# Patient Record
Sex: Female | Born: 1980 | Race: White | Hispanic: No | Marital: Married | Smoking: Never smoker
Health system: Southern US, Community
[De-identification: ages and names within clinical notes are randomized; demographics above are authoritative.]

## PROBLEM LIST (undated history)

## (undated) DIAGNOSIS — T7840XA Allergy, unspecified, initial encounter: Secondary | ICD-10-CM

## (undated) HISTORY — DX: Allergy, unspecified, initial encounter: T78.40XA

---

## 2002-01-08 ENCOUNTER — Other Ambulatory Visit: Admission: RE | Admit: 2002-01-08 | Discharge: 2002-01-08 | Payer: Self-pay | Admitting: Family Medicine

## 2003-01-27 ENCOUNTER — Other Ambulatory Visit: Admission: RE | Admit: 2003-01-27 | Discharge: 2003-01-27 | Payer: Self-pay | Admitting: Family Medicine

## 2004-06-21 ENCOUNTER — Other Ambulatory Visit: Admission: RE | Admit: 2004-06-21 | Discharge: 2004-06-21 | Payer: Self-pay | Admitting: Obstetrics and Gynecology

## 2005-07-11 ENCOUNTER — Other Ambulatory Visit: Admission: RE | Admit: 2005-07-11 | Discharge: 2005-07-11 | Payer: Self-pay | Admitting: Obstetrics and Gynecology

## 2006-08-05 ENCOUNTER — Other Ambulatory Visit: Admission: RE | Admit: 2006-08-05 | Discharge: 2006-08-05 | Payer: Self-pay | Admitting: Obstetrics and Gynecology

## 2008-12-21 ENCOUNTER — Observation Stay (HOSPITAL_COMMUNITY): Admission: RE | Admit: 2008-12-21 | Discharge: 2008-12-21 | Payer: Self-pay | Admitting: Obstetrics and Gynecology

## 2008-12-30 ENCOUNTER — Inpatient Hospital Stay (HOSPITAL_COMMUNITY): Admission: AD | Admit: 2008-12-30 | Discharge: 2009-01-02 | Payer: Self-pay | Admitting: Obstetrics and Gynecology

## 2010-05-22 ENCOUNTER — Inpatient Hospital Stay (HOSPITAL_COMMUNITY): Admission: AD | Admit: 2010-05-22 | Discharge: 2010-05-24 | Payer: Self-pay | Admitting: Obstetrics and Gynecology

## 2010-12-08 LAB — CBC
HCT: 35.7 % — ABNORMAL LOW (ref 36.0–46.0)
MCHC: 33.2 g/dL (ref 30.0–36.0)
Platelets: 225 10*3/uL (ref 150–400)
Platelets: 260 10*3/uL (ref 150–400)
RBC: 3.48 MIL/uL — ABNORMAL LOW (ref 3.87–5.11)
RDW: 16.4 % — ABNORMAL HIGH (ref 11.5–15.5)
RDW: 16.4 % — ABNORMAL HIGH (ref 11.5–15.5)
WBC: 11.3 10*3/uL — ABNORMAL HIGH (ref 4.0–10.5)
WBC: 16.5 10*3/uL — ABNORMAL HIGH (ref 4.0–10.5)

## 2010-12-08 LAB — RPR: RPR Ser Ql: NONREACTIVE

## 2011-01-03 LAB — CBC
HCT: 26.4 % — ABNORMAL LOW (ref 36.0–46.0)
HCT: 34.1 % — ABNORMAL LOW (ref 36.0–46.0)
Hemoglobin: 11.2 g/dL — ABNORMAL LOW (ref 12.0–15.0)
MCV: 89.4 fL (ref 78.0–100.0)
Platelets: 164 10*3/uL (ref 150–400)
RBC: 2.95 MIL/uL — ABNORMAL LOW (ref 3.87–5.11)
RBC: 3.87 MIL/uL (ref 3.87–5.11)
RDW: 15.6 % — ABNORMAL HIGH (ref 11.5–15.5)
WBC: 11.8 10*3/uL — ABNORMAL HIGH (ref 4.0–10.5)
WBC: 12.4 10*3/uL — ABNORMAL HIGH (ref 4.0–10.5)

## 2011-01-03 LAB — RPR: RPR Ser Ql: NONREACTIVE

## 2011-02-06 NOTE — H&P (Signed)
NAMEJADORE, MCGUFFIN                ACCOUNT NO.:  [id number]   MEDICAL RECORD NO.:  [id number]          PATIENT TYPE:  INP   LOCATION:  9164                          F[facility]ILITY:  [facility]   PHYSICIAN:  Hal Morales, M.D.DATE OF BIRTH:  May 06, 1981   DATE OF ADMISSION:  12/30/2008  DATE OF DISCHARGE:                              HISTORY & PHYSICAL   Marissa Copeland is a 30 year old gravida 1, para 0 at 48 weeks who presents  today in early labor.  She denies any leaking or bleeding and reports  positive fetal movement.   Pregnancy has been remarkable for:  1. Successful external version on December 21, 2008.  2. History of migraines.  3. History of depression, but no current medications.  4. Anemia.  5. Group B strep negative.   PRENATAL LABS:  Blood type is A+.  Rh antibody negative.  VDRL  nonreactive.  Rubella titer positive.  Hepatitis B surface antigen is  negative.  She declined first trimester screen.  She also declined  quadruple screen.  She had a normal Glucola.  Hemoglobin upon entering  the practice was 11.5; it was 10.8 at 29 weeks.  Pap report was not  noted on her prenatal.  GC and chlamydia cultures were declined in the  first trimester.  GC and chlamydia cultures were negative in the third  trimester.  Group B strep culture was also negative at that time.   HISTORY OF PRESENT PREGNANCY:  The patient entered care at approximately  7 weeks.  She had some nausea, for which she was placed on Phenergan and  Reglan.  She had an ultrasound at her first visit, which gave an [facility] of  January 06, 2009 -- which was approximately 8 days differently from her  LMP dating of December 29, 2008.  She was still having some vomiting at  night by 12 weeks; Zofran had been tried, but that was not helpful.  She  declined first and second trimester genetic screening.  She received  H1N1 at 16 weeks.  She had an ultrasound at 19 weeks, showing normal  growth, posterior placenta and normal cervical length.   One-hour Glucola  was normal.  Hemoglobin was 10.9 at 29 weeks.  She was recommended to  start iron-rich foods.  She had some vulvar itching at 39 weeks, but no  significant findings were noted.  Group B strep culture and other  cultures were negative at 36 weeks.  At 37 weeks she had an ultrasound  for position.  The fetus was noted to be breech.  She was offered the  option of observation, C-section or external version.  She wished to  proceed with external version.  She had normal fluid at that time.  She  had a successful external version on December 21, 2008 with Dr. Su Hilt,  and the fetus has remained in a vertex position.   OBSTETRICAL HISTORY:  The patient is a primigravida.   MEDICAL HISTORY:  She reports the usual childhood illnesses.  She is a  previous birth control [place] and [place] user.  She has had one  UTI in the  past.   SURGICAL HISTORY:  Includes wisdom teeth x2 in April 2009, and gum  surgery in 2008.  She has had some mood swings and rebound depression  when she stopped treatment for migraines prior to pregnancy.   She has an allergy to AMOXICILLIN which causes her to break out in  hives.   FAMILY HISTORY:  Her paternal grandfather had a pacemaker.  Maternal  grandfather and maternal aunt had type 2 diabetes.  Maternal grandfather  had Alzheimer's.  Maternal grandmother had breast cancer after  menopause.  Paternal uncle had depression, and had a suicide attempt.   GENETIC HISTORY:  Unremarkable.  The patient does have a history of some  migraines.   SOCIAL HISTORY:  The patient is married to the father of the baby; he is  involved and supportive, name is Zonnie Landen, Montez Hageman.  (he goes by [employer]).  The patient has a  bachelor's degree; she is a Runner, broadcasting/film/video.  Her husband has  a Manufacturing engineer; he is an Event organiser.  She has been followed by  the certified nurse midwife service at [facility].  She denies  any alcohol, drug or tobacco use during this  pregnancy.   PHYSICAL EXAMINATION:  VITAL SIGNS:  Stable.  The patient is afebrile.  HEENT:  Within normal limits.  LUNGS:  Breath sounds are clear.  HEART:  Regular rate and rhythm without murmur.  BREASTS:  Soft and nontender.  ABDOMEN:  Fundal height is approximately 38 cm; estimated fetal weight  is 6.5 to 7 pounds.  Uterine contractions are every 4-6 minutes, 80  seconds in duration and strong quality.  Cervix  exam was 4, 100% vertex  at a 0 station.  Fetal heart rate is currently reactive.  EXTREMITIES:  Deep tendon reflexes are 2+ without clonus.  There is 1+  edema noted.   IMPRESSION:  1. Intrauterine pregnancy at 39 weeks.  2. History of successful external version, with fetus still in a      vertex presentation.  3. Early active labor.  4. Negative group B strep.   PLAN:  1. Admit to birthing suite and consult with Dr. Dierdre Forth as      attending physician.  2. Routine certified nurse midwife orders.  3. Patient prefers to ambulate at present, and may consider pain      medication as labor advances.      Renaldo Reel Emilee Hero, C.N.M.      Hal Morales, M.D.  Electronically Signed    VLL/MEDQ  D:  12/30/2008  T:  12/30/2008  Job:  [id number]

## 2011-02-06 NOTE — H&P (Signed)
NAMEBRENNA, Marissa Copeland                ACCOUNT NO.:  [id number]   MEDICAL RECORD NO.:  [id number]          PATIENT TYPE:  OBV   LOCATION:  9172                          [facility]ILITY:  [facility]   PHYSICIAN:  Osborn Coho, M.D.   DATE OF BIRTH:  1981-04-06   DATE OF ADMISSION:  12/21/2008  DATE OF DISCHARGE:                              HISTORY & PHYSICAL   Ms. Marissa Copeland is a 30 year old gravida 1, para 0, at 37-5/7 weeks, who  presents today for external version.  Fetus was noted to be breech on  her ultrasound over the last week and she was consented for version.  Her pregnancy has been remarkable for:  1. Recent diagnosis of breech presentation.  2. Migraines.  3. History of mild depression.   PRENATAL LABS:  Blood type is A+, Rh antibody negative.  VDRL  nonreactive.  Rubella titer positive.  Hepatitis B surface antigen  negative.  HIV is not noted on her record.  RPR is nonreactive.  Group B  strep culture is not noted on her record.  One-hour Glucola was  negative.  Hemoglobin upon entering the practice was 11.5.  It was 10.9  at 28 weeks.  One-hour Glucola was normal.  The patient declined first  trimester screen and AFP, quadruple screen.  She had a Group B strep  culture done at 34 weeks when she had some spotting.  These were all  negative as well as GC and chlamydia cultures.   HISTORY OF PRESENT PREGNANCY:  The patient entered care at approximately  8-10 weeks.  She had had an early ultrasound at 7 weeks for dating,  which gave an [facility] of January 06, 2009.  She had some vomiting in early  pregnancy.  She declined first trimester screen and quadruple screen and  AFP.  She was using Zofran and some Phenergan.  She had a normal  Glucola.  She had an ultrasound at 19 weeks showing normal growth,  placenta posterior.  She had a normal Glucola.  Hemoglobin was 10.9 at  28 weeks.  She was placed on iron supplementation.  She had some  spotting at 34 weeks.  Cervix was slightly friable at that  point and was  closed and long.  GC, chlamydia and Group B strep culture were done and  were all negative.  She then had an ultrasound over the last week  showing a breech presentation.  She was then consented for external  version.   OBSTETRICAL HISTORY:  The patient is primigravida.   MEDICAL HISTORY:  She is a previous [place] and [place] user.  Pap was  normal just prior to the onset of her pregnancy.  She reports the usual  childhood illnesses.  She has history of 1 UTI in the past.  She had  some mood swings, rebound depression when she stopped one of her  migraine medications.  The patient also has some migraines.   SURGICAL HISTORY:  Includes wisdom teeth removed in April of 2009, gum  surgery in 2008.   She is allergic to AMOXICILLIN, which causes her to break out in  a rash.   FAMILY HISTORY:  Paternal grandfather had a pacemaker.  Maternal  grandfather and maternal aunt had type 2 diabetes.  Maternal grandfather  had Alzheimer's.  Maternal grandmother had breast cancer after  menopause.  Paternal uncle, depression, had a suicide attempt.   GENETIC HISTORY:  Unremarkable.   SOCIAL HISTORY:  The patient is married to the father of the baby.  He  is involved and supportive.  His name is Nolen Mu, Montez Hageman.  The patient  is Caucasian.  She denies a religious affiliation.  She has a bachelor's  degree.  She is a Runner, broadcasting/film/video.  Her husband has his master's degree.  He is  an Event organiser.  She has been followed by the certified nurse  midwife service [facility].  She denies any alcohol, drug or  tobacco use during this pregnancy.   PHYSICAL EXAMINATION:  VITAL SIGNS:  Stable.  HEENT:  Within normal limits.  LUNGS:  Bilateral breath sounds are clear.  HEART:  Regular rate and rhythm without murmur.  BREASTS:  Soft and nontender.  ABDOMEN:  Fundal height is approximately 37 cm.  Estimated fetal weight  7 to 7-1/2 pounds.  Uterine contractions per patient report are very   minimal.  Fetus is in the 140s by Doppler.  EXTREMITIES:  Deep tendon reflexes are 2+ without clonus.  There is a  trace edema noted.  PELVIC:  Exam is deferred.   IMPRESSION:  1. Intrauterine pregnancy at 37-5/7 weeks.  2. Breech presentation on last ultrasound.  3. Group B strep negative.   PLAN:  1. Admit to [facility] per consult Dr. Su Hilt as      attending physician for external version.  2. Risks and benefits of external version were reviewed with the      patient including fetal distress, failure of method, need for      cesarean section, rupture of membranes and other risks.  The      patient and her husband seem to understand these risks and wish to      proceed.  3. Will do bedside ultrasound to verify continuing breech      presentation.      Renaldo Reel Emilee Hero, C.N.M.      Osborn Coho, M.D.  Electronically Signed    VLL/MEDQ  D:  12/21/2008  T:  12/21/2008  Job:  [id number]

## 2011-02-06 NOTE — Discharge Summary (Signed)
NAMEARADHYA, Marissa Copeland                ACCOUNT NO.:  [id number]   MEDICAL RECORD NO.:  [id number]          PATIENT TYPE:  OBV   LOCATION:  9172                          [facility]ILITY[facility]  [facility]   PHYSICIAN:  Osborn Coho, M.D.   DATE OF BIRTH:  [birth year]   DATE OF ADMISSION:  12/21/2008  DATE OF DISCHARGE:  12/21/2008                               DISCHARGE SUMMARY   Ms. Luckman is a 30 year old gravida 1, para 0 at 37-5/7 weeks who  presented for external cephalic version today secondary to fetus in  breech presentation.  The patient's pregnancy had been remarkable for,  1. Breech presentation on last ultrasound.  2. Migraines.  3. History of mild depression.   On admission, fetal heart rate was reactive.  Fetus was still breech  with the head in the left upper quadrant.  On ultrasound, she had had a  previous ultrasound on March 26 with normal fluid.  The patient was  consented for version at that time.   She then was brought in today and was given 1 dose of terbutaline and  external cephalic version was performed with a forward roll without  difficulty.  Fetal heart tracing remained reactive.  She had an  ultrasound to evaluate for fluid and AFI was 16.43 which was at the 63rd  percentile.  Fetus remained in the vertex presentation and the patient  had minimal to no contractions.  Fetal heart rate was reactive.  She was  deemed to receive full benefit of hospital stay and was discharged home.  The speculum exam was also done showing no pooling, no ferning, and  cervix was a fingertip, 60% vertex at -2station.   DISCHARGE INSTRUCTIONS:  The patient will monitor for the onset of  labor.  She will also notify us if and when she comes in that the fetus  had been previously in a breech presentation to ensure adequate  evaluation of fetal presentation early in labor process.  Fetal kick  counts were also reviewed with the patient.   DISCHARGE MEDICATIONS:  Prenatal vitamin 1 p.o. daily.   Discharge  followup will occur on April 1 at 10:30 a.m. as previously scheduled or  p.r.n.      Renaldo Reel Emilee Hero, C.N.M.      Osborn Coho, M.D.  Electronically Signed    VLL/MEDQ  D:  12/21/2008  T:  12/22/2008  Job:  [id number]

## 2012-10-02 ENCOUNTER — Ambulatory Visit (INDEPENDENT_AMBULATORY_CARE_PROVIDER_SITE_OTHER): Payer: BC Managed Care – PPO | Admitting: Obstetrics and Gynecology

## 2012-10-02 ENCOUNTER — Encounter: Payer: Self-pay | Admitting: Obstetrics and Gynecology

## 2012-10-02 VITALS — BP 100/60 | Resp 14 | Ht 63.0 in | Wt 128.0 lb

## 2012-10-02 DIAGNOSIS — Z01419 Encounter for gynecological examination (general) (routine) without abnormal findings: Secondary | ICD-10-CM

## 2012-10-02 DIAGNOSIS — F341 Dysthymic disorder: Secondary | ICD-10-CM

## 2012-10-02 DIAGNOSIS — G43909 Migraine, unspecified, not intractable, without status migrainosus: Secondary | ICD-10-CM

## 2012-10-02 DIAGNOSIS — F419 Anxiety disorder, unspecified: Secondary | ICD-10-CM

## 2012-10-02 DIAGNOSIS — F329 Major depressive disorder, single episode, unspecified: Secondary | ICD-10-CM

## 2012-10-02 DIAGNOSIS — Z124 Encounter for screening for malignant neoplasm of cervix: Secondary | ICD-10-CM

## 2012-10-02 MED ORDER — LEVONORG-ETH ESTRAD TRIPHASIC PO TABS: 1.0000 | ORAL_TABLET | Freq: Every day | ORAL | Status: AC

## 2012-10-02 NOTE — Progress Notes (Signed)
The patient reports:no complaints  Contraception:oral contraceptives (estrogen/progesterone)  Last mammogram: never Last pap: 08/27/11 WNL  GC/Chlamydia cultures offered: declined HIV/RPR/HbsAg offered:  declined HSV 1 and 2 glycoprotein offered: declined  Menstrual cycle regular and monthly: Yes Menstrual flow normal: Yes  Urinary symptoms: none Normal bowel movements: Yes Reports abuse at home: No

## 2012-10-02 NOTE — Progress Notes (Signed)
Subjective:    Marissa Copeland is a 32 y.o. female, G2P2002, who presents for an annual exam.   Patient reports:  Dojng well--no issues.  Wants refill on OCPs.  Children doing well.  May decide to home school.  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    History   Social History  . Marital Status: Married    Spouse Name: N/A    Number of Children: N/A  . Years of Education: N/A   Social History Main Topics  . Smoking status: Never Smoker   . Smokeless tobacco: Never Used  . Alcohol Use: No  . Drug Use: No  . Sexually Active: Yes -- Female partner(s)    Birth Control/ Protection: Pill     Comment: married    Other Topics Concern  . None   Social History Narrative  . None    Menstrual cycle:   LMP: Patient's last menstrual period was 09/24/2012.           Cycle: Normal on OCPs  The following portions of the patient's history were reviewed and updated as appropriate: allergies, current medications, past family history, past medical history, past social history, past surgical history and problem list.  Review of Systems Pertinent items are noted in HPI. Breast:Negative for breast lump,nipple discharge or nipple retraction Gastrointestinal: Negative for abdominal pain, change in bowel habits or rectal bleeding Urinary:negative   Objective:    BP 100/60  Resp 14  Ht 5\' 3"  (1.6 m)  Wt 128 lb (58.06 kg)  BMI 22.67 kg/m2  LMP 09/24/2012    Weight:  Wt Readings from Last 1 Encounters:  10/02/12 128 lb (58.06 kg)          BMI: Body mass index is 22.67 kg/(m^2).  General Appearance: Alert, appropriate appearance for age. No acute distress HEENT: Grossly normal Neck / Thyroid: Supple, no masses, nodes or enlargement Lungs: clear to auscultation bilaterally Back: No CVA tenderness Breast Exam: No masses or nodes.No dimpling, nipple retraction or discharge. Cardiovascular: Regular rate and rhythm. S1, S2, no murmur Gastrointestinal: Soft, non-tender, no  masses or organomegaly Pelvic Exam: Vulva and vagina appear normal. Bimanual exam reveals normal uterus and adnexa. Rectovaginal: normal rectal, no masses Lymphatic Exam: Non-palpable nodes in neck, clavicular, axillary, or inguinal regions Skin: no rash or abnormalities Neurologic: Normal gait and speech, no tremor  Psychiatric: Alert and oriented, appropriate affect.   Wet Prep:not applicable Urinalysis:not applicable UPT: Not done   Assessment:    Normal gyn exam Contraceptive management    Plan:    Mammogram: Age 47 Pap:  Done today STD screening: declined Contraception:oral contraceptives (estrogen/progesterone) --Rx OCP x 1 year ([place] generic)      Shermika Balthaser, [place], [place]

## 2012-10-03 LAB — PAP IG W/ RFLX HPV ASCU

## 2013-01-29 ENCOUNTER — Ambulatory Visit (INDEPENDENT_AMBULATORY_CARE_PROVIDER_SITE_OTHER): Payer: BC Managed Care – PPO | Admitting: Family Medicine

## 2013-01-29 VITALS — BP 104/70 | HR 105 | Temp 99.0°F | Resp 16 | Ht 63.5 in | Wt 124.0 lb

## 2013-01-29 DIAGNOSIS — R112 Nausea with vomiting, unspecified: Secondary | ICD-10-CM

## 2013-01-29 DIAGNOSIS — B349 Viral infection, unspecified: Secondary | ICD-10-CM

## 2013-01-29 DIAGNOSIS — B9789 Other viral agents as the cause of diseases classified elsewhere: Secondary | ICD-10-CM

## 2013-01-29 DIAGNOSIS — J029 Acute pharyngitis, unspecified: Secondary | ICD-10-CM

## 2013-01-29 LAB — POCT RAPID STREP A (OFFICE): Rapid Strep A Screen: NEGATIVE

## 2013-01-29 MED ORDER — ONDANSETRON 4 MG PO TBDP: ORAL_TABLET | ORAL | Status: DC

## 2013-01-29 NOTE — Progress Notes (Signed)
Subjective: 32 year old lady who has had a sore throat for couple of days. She had some ear pain. She has not documented a fever at home. She has has nausea and vomiting a couple times. She has not been feeling well. 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 She has a history of a lot of strep infections when she was little younger out of college. She is allergic to amoxicillin which caused her a rash.  Objective: Healthy-appearing and lady in no major distress. TMs normal. Throat erythematous without exudate. She has a couple of small nodes which are little tender. No posterior nodes. Chest is clear to auscultation. Heart regular without murmurs.  Assessment: Pharyngitis  Plan: Strep screen  Results for orders placed in visit on 01/29/13  POCT RAPID STREP A (OFFICE)      Result Value Range   Rapid Strep A Screen Negative  Negative   Assessment: Viral syndrome  Plan: Zofran for nausea. Fluids. Rest.

## 2013-01-29 NOTE — Patient Instructions (Addendum)
Viral Pharyngitis  Viral pharyngitis is a viral infection that produces redness, pain, and swelling (inflammation) of the throat. It can spread from person to person (contagious).  CAUSES  Viral pharyngitis is caused by inhaling a large amount of certain germs called viruses. Many different viruses cause viral pharyngitis.  SYMPTOMS  Symptoms of viral pharyngitis include:   Sore throat.   Tiredness.   Stuffy nose.   Low-grade fever.   Congestion.   Cough.  TREATMENT  Treatment includes rest, drinking plenty of fluids, and the use of over-the-counter medication (approved by your caregiver).  HOME CARE INSTRUCTIONS    Drink enough fluids to keep your urine clear or pale yellow.   Eat soft, cold foods such as ice cream, frozen ice pops, or gelatin dessert.   Gargle with warm salt water (1 tsp salt per 1 qt of water).   If over age 7, throat lozenges may be used safely.   Only take over-the-counter or prescription medicines for pain, discomfort, or fever as directed by your caregiver. Do not take aspirin.  To help prevent spreading viral pharyngitis to others, avoid:   Mouth-to-mouth contact with others.   Sharing utensils for eating and drinking.   Coughing around others.  SEEK MEDICAL CARE IF:    You are better in a few days, then become worse.   You have a fever or pain not helped by pain medicines.   There are any other changes that concern you.  Document Released: 06/20/2005 Document Revised: 12/03/2011 Document Reviewed: 11/16/2010  ExitCare Patient Information 2013 [employer].

## 2013-02-09 ENCOUNTER — Telehealth: Payer: Self-pay

## 2013-02-09 NOTE — Telephone Encounter (Signed)
zpack treats strep.  If she is having fevers, white exudate on tonsils, no cough still, ok to call in penicillin 500mg  1 tab po bid disp 20 no ref.  If she is getting worse, needs to be seen.

## 2013-02-09 NOTE — Telephone Encounter (Signed)
Actually - looks like pt was seen by dr. Alwyn Ren - not by me.  And she was advised symptomatic care, not given a zpack.  And she is allergic to amoxicillin so do NOT call in penicillin

## 2013-02-09 NOTE — Telephone Encounter (Signed)
PT STATES SHE DIDN'T HAVE STREP BUT BOTH HER PARENTS CAME OVER TO WATCH HER CHILDREN WHILE SHE WAS AT THE DR AND THEY BOTH TESTED POSITIVE FOR STREP AND HER THROAT IS STILL SORE, NEED TO HAVE SOMETHING CALLED IN AND THE Z-PAK DOESN'T HELP. PLEASE CALL PT AT [phone]   [facility] [address]

## 2013-02-09 NOTE — Telephone Encounter (Signed)
Spoke to her and she does still have sore throat. She does not have any fever, she is asking for treatment for strep, since both of her parents have had strep recently and her sore throat continues. Please advise. ( she has pcn/amox. Allergy)

## 2013-02-10 ENCOUNTER — Other Ambulatory Visit: Payer: Self-pay | Admitting: Family Medicine

## 2013-02-10 MED ORDER — AZITHROMYCIN 250 MG PO TABS: ORAL_TABLET | ORAL | Status: DC

## 2013-02-10 NOTE — Telephone Encounter (Signed)
Please call in a Zithromax prescription for her to her 250 mg #6 take 2 initially then one daily for 4 days

## 2013-02-11 NOTE — Telephone Encounter (Signed)
Thanks . I did advise her. Asked her to follow up if not better soon.

## 2013-10-12 ENCOUNTER — Other Ambulatory Visit: Payer: Self-pay | Admitting: Family Medicine

## 2013-10-12 DIAGNOSIS — Z20828 Contact with and (suspected) exposure to other viral communicable diseases: Secondary | ICD-10-CM

## 2013-10-12 MED ORDER — OSELTAMIVIR PHOSPHATE 75 MG PO CAPS: 75.0000 mg | ORAL_CAPSULE | Freq: Every day | ORAL | Status: DC

## 2013-10-12 NOTE — Progress Notes (Signed)
Husband diagnosed with influenza A last night.  Preventative.

## 2014-03-18 ENCOUNTER — Ambulatory Visit (INDEPENDENT_AMBULATORY_CARE_PROVIDER_SITE_OTHER): Payer: BC Managed Care – PPO | Admitting: Family Medicine

## 2014-03-18 ENCOUNTER — Ambulatory Visit (INDEPENDENT_AMBULATORY_CARE_PROVIDER_SITE_OTHER): Payer: BC Managed Care – PPO

## 2014-03-18 VITALS — BP 114/60 | HR 112 | Temp 99.1°F | Resp 18 | Ht 63.5 in | Wt 126.2 lb

## 2014-03-18 DIAGNOSIS — R509 Fever, unspecified: Secondary | ICD-10-CM

## 2014-03-18 DIAGNOSIS — R1012 Left upper quadrant pain: Secondary | ICD-10-CM

## 2014-03-18 DIAGNOSIS — J029 Acute pharyngitis, unspecified: Secondary | ICD-10-CM | POA: Diagnosis not present

## 2014-03-18 DIAGNOSIS — J020 Streptococcal pharyngitis: Secondary | ICD-10-CM

## 2014-03-18 LAB — POCT RAPID STREP A (OFFICE): Rapid Strep A Screen: POSITIVE — AB

## 2014-03-18 LAB — COMPREHENSIVE METABOLIC PANEL
ALT: 9 U/L (ref 0–35)
Albumin: 4.1 g/dL (ref 3.5–5.2)
Alkaline Phosphatase: 50 U/L (ref 39–117)
CO2: 23 mEq/L (ref 19–32)
Potassium: 3.6 mEq/L (ref 3.5–5.3)
Sodium: 136 mEq/L (ref 135–145)
Total Bilirubin: 0.9 mg/dL (ref 0.2–1.2)
Total Protein: 6.7 g/dL (ref 6.0–8.3)

## 2014-03-18 LAB — COMPREHENSIVE METABOLIC PANEL WITH GFR
AST: 15 U/L (ref 0–37)
BUN: 11 mg/dL (ref 6–23)
Calcium: 8.7 mg/dL (ref 8.4–10.5)
Chloride: 102 meq/L (ref 96–112)
Creat: 0.68 mg/dL (ref 0.50–1.10)
Glucose, Bld: 71 mg/dL (ref 70–99)

## 2014-03-18 LAB — CBC
HCT: 38.8 % (ref 36.0–46.0)
Hemoglobin: 12.8 g/dL (ref 12.0–15.0)
MCH: 29.9 pg (ref 26.0–34.0)
MCHC: 33 g/dL (ref 30.0–36.0)
MCV: 90.8 fL (ref 78.0–100.0)
Platelets: 163 10*3/uL (ref 150–400)
RBC: 4.27 MIL/uL (ref 3.87–5.11)
RDW: 13.5 % (ref 11.5–15.5)
WBC: 10.2 10*3/uL (ref 4.0–10.5)

## 2014-03-18 MED ORDER — CLINDAMYCIN HCL 300 MG PO CAPS: 300.0000 mg | ORAL_CAPSULE | Freq: Three times a day (TID) | ORAL | Status: DC

## 2014-03-18 NOTE — Progress Notes (Signed)
Chief Complaint:  Chief Complaint  Patient presents with  . Pain underneath rib    has had pain right below her rib since friday, sensitive to the touch  . Fever  . Generalized Body Aches  . Sore Throat    HPI: Lucianne MussKristi J Keng is a 33 y.o. female who is here for  Sore throat, left lower rib pain, left upper abd pain, fever and sore throat x 1-2 days, generalized fatigue since Friday, she has had strep throat in the past, she has taken tylenol last night fevers have been 100.8-101. Works with children. She has had no rashes, no joint pain, + nausea, no vomiting, no diarrhea. No sick contacts. Poor po.   Past Medical History  Diagnosis Date  . Allergy    History reviewed. No pertinent past surgical history. History   Social History  . Marital Status: Married    Spouse Name: N/A    Number of Children: N/A  . Years of Education: N/A   Social History Main Topics  . Smoking status: Never Smoker   . Smokeless tobacco: Never Used  . Alcohol Use: No  . Drug Use: No  . Sexual Activity: Yes    Partners: Male    Birth Control/ Protection: Pill, Condom     Comment: married    Other Topics Concern  . None   Social History Narrative  . None   Family History  Problem Relation Age of Onset  . Aneurysm Father   . Diabetes Maternal Aunt   . Diabetes Maternal Grandfather   . Fibromyalgia Paternal Grandmother   . Heart disease Paternal Grandfather    Allergies  Allergen Reactions  . Amoxicillin Rash   Prior to Admission medications   Medication Sig Start Date End Date Taking? Authorizing Lexys Milliner  levonorgestrel-ethinyl estradiol (ENPRESSE,TRIVORA) tablet Take 1 tablet by mouth daily. 10/02/12  Yes Nigel BridgemanVicki Latham, CNM     ROS: The patient denies  night sweats, unintentional weight loss, chest pain, palpitations, wheezing, dyspnea on exertion,  vomiting, dysuria, hematuria, melena, numbness,  or tingling.   All other systems have been reviewed and were otherwise negative  with the exception of those mentioned in the HPI and as above.    PHYSICAL EXAM: Filed Vitals:   03/18/14 1022  BP:   Pulse: 112  Temp: 99.1 F (37.3 C)  Resp:    Filed Vitals:   03/18/14 0854  Height: 5' 3.5" (1.613 m)  Weight: 126 lb 3.2 oz (57.244 kg)   Body mass index is 22 kg/(m^2).  General: Alert, no acute distress HEENT:  Normocephalic, atraumatic, oropharynx patent. EOMI, , minimally erythematous, no exudates. TM nl, sinus nontender Cardiovascular:  Regular rate and rhythm, no rubs murmurs or gallops.  No Carotid bruits, radial pulse intact. No pedal edema.  Respiratory: Clear to auscultation bilaterally.  No wheezes, rales, or rhonchi.  No cyanosis, no use of accessory musculature GI: No organomegaly, abdomen is soft and + LUQ abd tenderness, positive bowel sounds.  No masses. Skin: No rashes. Neurologic: Facial musculature symmetric. Psychiatric: Patient is appropriate throughout our interaction. Lymphatic: No cervical lymphadenopathy Musculoskeletal: Gait intact.   LABS: Results for orders placed in visit on 03/18/14  POCT RAPID STREP A (OFFICE)      Result Value Ref Range   Rapid Strep A Screen Positive (*) Negative     EKG/XRAY:   [facility] No cariopulm process No free air, no obstruction  ASSESSMENT/PLAN: Encounter Diagnoses  Name Primary?  . Acute pharyngitis, unspecified pharyngitis type Yes  . Fever, unspecified fever cause   . LUQ abdominal pain   . Streptococcal sore throat    She has had levaquin for strep in the past, I am not going to give her this. Will treat with Clindamycin since she ahs a PCN allergy and also she state z packs do not work for her.  Rx clindamycin 300 mg TID x 10 days Salt water gargles Tylenol/motrin prn pain and fever Labs pending F/u prn  Gross sideeffects, risk and benefits, and alternatives of medications d/w patient. Patient is aware that all medications have potential  sideeffects and we are unable to predict every sideeffect or drug-drug interaction that may occur.  LE, THAO PHUONG, DO 03/18/2014 10:30 AM

## 2014-03-18 NOTE — Patient Instructions (Signed)
Strep Throat Strep throat is an infection of the throat caused by a bacteria named Streptococcus pyogenes. Your caregiver may call the infection streptococcal "tonsillitis" or "pharyngitis" depending on whether there are signs of inflammation in the tonsils or back of the throat. Strep throat is most common in children aged 34-15 years during the cold months of the year, but it can occur in people of any age during any season. This infection is spread from person to person (contagious) through coughing, sneezing, or other close contact. SYMPTOMS   Fever or chills.  Painful, swollen, red tonsils or throat.  Pain or difficulty when swallowing.  White or yellow spots on the tonsils or throat.  Swollen, tender lymph nodes or "glands" of the neck or under the jaw.  Red rash all over the body (rare). DIAGNOSIS  Many different infections can cause the same symptoms. A test must be done to confirm the diagnosis so the right treatment can be given. A "rapid strep test" can help your caregiver make the diagnosis in a few minutes. If this test is not available, a light swab of the infected area can be used for a throat culture test. If a throat culture test is done, results are usually available in a day or two. TREATMENT  Strep throat is treated with antibiotic medicine. HOME CARE INSTRUCTIONS   Gargle with 1 tsp of salt in 1 cup of warm water, 3-4 times per day or as needed for comfort.  Family members who also have a sore throat or fever should be tested for strep throat and treated with antibiotics if they have the strep infection.  Make sure everyone in your household washes their hands well.  Do not share food, drinking cups, or personal items that could cause the infection to spread to others.  You may need to eat a soft food diet until your sore throat gets better.  Drink enough water and fluids to keep your urine clear or pale yellow. This will help prevent dehydration.  Get plenty of  rest.  Stay home from school, daycare, or work until you have been on antibiotics for 24 hours.  Only take over-the-counter or prescription medicines for pain, discomfort, or fever as directed by your caregiver.  If antibiotics are prescribed, take them as directed. Finish them even if you start to feel better. SEEK MEDICAL CARE IF:   The glands in your neck continue to enlarge.  You develop a rash, cough, or earache.  You cough up green, yellow-brown, or bloody sputum.  You have pain or discomfort not controlled by medicines.  Your problems seem to be getting worse rather than better. SEEK IMMEDIATE MEDICAL CARE IF:   You develop any new symptoms such as vomiting, severe headache, stiff or painful neck, chest pain, shortness of breath, or trouble swallowing.  You develop severe throat pain, drooling, or changes in your voice.  You develop swelling of the neck, or the skin on the neck becomes red and tender.  You have a fever.  You develop signs of dehydration, such as fatigue, dry mouth, and decreased urination.  You become increasingly sleepy, or you cannot wake up completely. Document Released: 09/07/2000 Document Revised: 08/27/2012 Document Reviewed: 11/09/2010 Exit[employer] Patient Information 2015 [employer]. This information is not intended to replace advice given to you by your health care Dossie Ocanas. Make sure you discuss any questions you have with your health care Minnie Shi.  

## 2014-03-19 ENCOUNTER — Telehealth: Payer: Self-pay | Admitting: Family Medicine

## 2014-03-19 LAB — EPSTEIN-BARR VIRUS VCA ANTIBODY PANEL
EBV EA IgG: 13.2 U/mL — ABNORMAL HIGH
EBV NA IgG: 61.1 U/mL — ABNORMAL HIGH
EBV VCA IgG: 193 U/mL — ABNORMAL HIGH
EBV VCA IgM: <10 U/mL

## 2014-03-19 NOTE — Telephone Encounter (Signed)
Spoke with pt about labs, she is doing ok, has some nausea withmeds, but will see how she does, she doe snto want any nausea meds at thsi time since had constipation issues. Will let m eknow if she needs a diffreent abx, she has allergies to PCN. Z pack does not work for her

## 2014-04-30 ENCOUNTER — Emergency Department (HOSPITAL_COMMUNITY): Payer: BC Managed Care – PPO

## 2014-04-30 ENCOUNTER — Encounter (HOSPITAL_COMMUNITY): Payer: Self-pay | Admitting: Emergency Medicine

## 2014-04-30 ENCOUNTER — Emergency Department (HOSPITAL_COMMUNITY)
Admission: EM | Admit: 2014-04-30 | Discharge: 2014-04-30 | Disposition: A | Discharge status: Home / Self Care | Payer: BC Managed Care – PPO | Attending: Emergency Medicine | Admitting: Emergency Medicine

## 2014-04-30 DIAGNOSIS — Z79899 Other long term (current) drug therapy: Secondary | ICD-10-CM | POA: Insufficient documentation

## 2014-04-30 DIAGNOSIS — Z880 Allergy status to penicillin: Secondary | ICD-10-CM | POA: Insufficient documentation

## 2014-04-30 DIAGNOSIS — K8020 Calculus of gallbladder without cholecystitis without obstruction: Secondary | ICD-10-CM

## 2014-04-30 DIAGNOSIS — Z792 Long term (current) use of antibiotics: Secondary | ICD-10-CM | POA: Insufficient documentation

## 2014-04-30 DIAGNOSIS — Z3202 Encounter for pregnancy test, result negative: Secondary | ICD-10-CM | POA: Insufficient documentation

## 2014-04-30 DIAGNOSIS — N200 Calculus of kidney: Secondary | ICD-10-CM | POA: Insufficient documentation

## 2014-04-30 DIAGNOSIS — R1011 Right upper quadrant pain: Secondary | ICD-10-CM | POA: Insufficient documentation

## 2014-04-30 LAB — LIPASE, BLOOD: Lipase: 47 U/L (ref 11–59)

## 2014-04-30 LAB — COMPREHENSIVE METABOLIC PANEL
ALBUMIN: 3.9 g/dL (ref 3.5–5.2)
ALT: 24 U/L (ref 0–35)
ANION GAP: 14 (ref 5–15)
AST: 34 U/L (ref 0–37)
Alkaline Phosphatase: 60 U/L (ref 39–117)
BUN: 14 mg/dL (ref 6–23)
CALCIUM: 9.5 mg/dL (ref 8.4–10.5)
CO2: 23 mEq/L (ref 19–32)
CREATININE: 0.7 mg/dL (ref 0.50–1.10)
Chloride: 102 mEq/L (ref 96–112)
GFR calc non Af Amer: >90 mL/min (ref >90)
Glucose, Bld: 118 mg/dL — ABNORMAL HIGH (ref 70–99)
POTASSIUM: 3.9 meq/L (ref 3.7–5.3)
Sodium: 139 mEq/L (ref 137–147)
TOTAL PROTEIN: 7.5 g/dL (ref 6.0–8.3)
Total Bilirubin: 0.9 mg/dL (ref 0.3–1.2)

## 2014-04-30 LAB — CBC WITH DIFFERENTIAL/PLATELET
BASOS ABS: 0 10*3/uL (ref 0.0–0.1)
Basophils Relative: 0 % (ref 0–1)
Eosinophils Absolute: 0 10*3/uL (ref 0.0–0.7)
Eosinophils Relative: 0 % (ref 0–5)
HEMATOCRIT: 38.3 % (ref 36.0–46.0)
HEMOGLOBIN: 12.9 g/dL (ref 12.0–15.0)
LYMPHS PCT: 7 % — AB (ref 12–46)
Lymphs Abs: 1 10*3/uL (ref 0.7–4.0)
MCH: 29.4 pg (ref 26.0–34.0)
MCHC: 33.7 g/dL (ref 30.0–36.0)
MCV: 87.2 fL (ref 78.0–100.0)
MONO ABS: 0.8 10*3/uL (ref 0.1–1.0)
MONOS PCT: 6 % (ref 3–12)
NEUTROS ABS: 12.5 10*3/uL — AB (ref 1.7–7.7)
NEUTROS PCT: 87 % — AB (ref 43–77)
Platelets: 258 10*3/uL (ref 150–400)
RBC: 4.39 MIL/uL (ref 3.87–5.11)
RDW: 13.2 % (ref 11.5–15.5)
WBC: 14.3 10*3/uL — AB (ref 4.0–10.5)

## 2014-04-30 LAB — URINALYSIS, ROUTINE W REFLEX MICROSCOPIC
GLUCOSE, UA: NEGATIVE mg/dL
Ketones, ur: 15 mg/dL — AB
Nitrite: NEGATIVE
PROTEIN: 100 mg/dL — AB
SPECIFIC GRAVITY, URINE: 1.031 — AB (ref 1.005–1.030)
UROBILINOGEN UA: 1 mg/dL (ref 0.0–1.0)
pH: 7 (ref 5.0–8.0)

## 2014-04-30 LAB — URINE MICROSCOPIC-ADD ON

## 2014-04-30 LAB — POC URINE PREG, ED: PREG TEST UR: NEGATIVE

## 2014-04-30 MED ORDER — SODIUM CHLORIDE 0.9 % IV BOLUS (SEPSIS)
1000.0000 mL | Freq: Once | INTRAVENOUS | Status: AC
  Administered 2014-04-30: 1000 mL via INTRAVENOUS

## 2014-04-30 MED ORDER — MORPHINE SULFATE 4 MG/ML IJ SOLN
4.0000 mg | Freq: Once | INTRAMUSCULAR | Status: AC
  Administered 2014-04-30: 4 mg via INTRAVENOUS
  Filled 2014-04-30: qty 1

## 2014-04-30 MED ORDER — HYDROCODONE-ACETAMINOPHEN 5-325 MG PO TABS: 1.0000 | ORAL_TABLET | Freq: Four times a day (QID) | ORAL | Status: AC | PRN

## 2014-04-30 MED ORDER — ONDANSETRON HCL 4 MG PO TABS: 4.0000 mg | ORAL_TABLET | Freq: Four times a day (QID) | ORAL | Status: DC

## 2014-04-30 MED ORDER — ONDANSETRON HCL 4 MG/2ML IJ SOLN
4.0000 mg | Freq: Once | INTRAMUSCULAR | Status: AC
  Administered 2014-04-30: 4 mg via INTRAVENOUS
  Filled 2014-04-30: qty 2

## 2014-04-30 NOTE — ED Provider Notes (Signed)
Medical screening examination/treatment/procedure(s) were conducted as a shared visit with non-physician practitioner(s) and myself.  I personally evaluated the patient during the encounter.   EKG Interpretation None      I interviewed and examined the patient. Lungs are CTAB. Cardiac exam wnl. Abdomen soft w/ mild ttp of epig/ruq area. Cholelithiasis noted on US. Possibly biliary colic. Mild elev of wbc but LFT's wnl. Pain controlled. Will d/c home.   Purvis SheffieldForrest Jeoffrey Eleazer, MD 04/30/14 [id number]

## 2014-04-30 NOTE — Discharge Instructions (Signed)
Biliary Colic  °Biliary colic is a steady or irregular pain in the upper abdomen. It is usually under the right side of the rib cage. It happens when gallstones interfere with the normal flow of bile from the gallbladder. Bile is a liquid that helps to digest fats. Bile is made in the liver and stored in the gallbladder. When you eat a meal, bile passes from the gallbladder through the cystic duct and the common bile duct into the small intestine. There, it mixes with partially digested food. If a gallstone blocks either of these ducts, the normal flow of bile is blocked. The muscle cells in the bile duct contract forcefully to try to move the stone. This causes the pain of biliary colic.  °SYMPTOMS  °· A person with biliary colic usually complains of pain in the upper abdomen. This pain can be: °· In the center of the upper abdomen just below the breastbone. °· In the upper-right part of the abdomen, near the gallbladder and liver. °· Spread back toward the right shoulder blade. °· Nausea and vomiting. °· The pain usually occurs after eating. °· Biliary colic is usually triggered by the digestive system's demand for bile. The demand for bile is high after fatty meals. Symptoms can also occur when a person who has been fasting suddenly eats a very large meal. Most episodes of biliary colic pass after 1 to 5 hours. After the most intense pain passes, your abdomen may continue to ache mildly for about 24 hours. °DIAGNOSIS  °After you describe your symptoms, your caregiver will perform a physical exam. He or she will pay attention to the upper right portion of your belly (abdomen). This is the area of your liver and gallbladder. An ultrasound will help your caregiver look for gallstones. Specialized scans of the gallbladder may also be done. Blood tests may be done, especially if you have fever or if your pain persists. °PREVENTION  °Biliary colic can be prevented by controlling the risk factors for gallstones. Some of  these risk factors, such as heredity, increasing age, and pregnancy are a normal part of life. Obesity and a high-fat diet are risk factors you can change through a healthy lifestyle. Women going through menopause who take hormone replacement therapy (estrogen) are also more likely to develop biliary colic. °TREATMENT  °· Pain medication may be prescribed. °· You may be encouraged to eat a fat-free diet. °· If the first episode of biliary colic is severe, or episodes of colic keep retuning, surgery to remove the gallbladder (cholecystectomy) is usually recommended. This procedure can be done through small incisions using an instrument called a laparoscope. The procedure often requires a brief stay in the hospital. Some people can leave the hospital the same day. It is the most widely used treatment in people troubled by painful gallstones. It is effective and safe, with no complications in more than 90% of cases. °· If surgery cannot be done, medication that dissolves gallstones may be used. This medication is expensive and can take months or years to work. Only small stones will dissolve. °· Rarely, medication to dissolve gallstones is combined with a procedure called shock-wave lithotripsy. This procedure uses carefully aimed shock waves to break up gallstones. In many people treated with this procedure, gallstones form again within a few years. °PROGNOSIS  °If gallstones block your cystic duct or common bile duct, you are at risk for repeated episodes of biliary colic. There is also a 25% chance that you will develop   a gallbladder infection(acute cholecystitis), or some other complication of gallstones within 10 to 20 years. If you have surgery, schedule it at a time that is convenient for you and at a time when you are not sick. °HOME CARE INSTRUCTIONS  °· Drink plenty of clear fluids. °· Avoid fatty, greasy or fried foods, or any foods that make your pain worse. °· Take medications as directed. °SEEK MEDICAL  CARE IF:  °· You develop a fever over 100.5° F (38.1° C). °· Your pain gets worse over time. °· You develop nausea that prevents you from eating and drinking. °· You develop vomiting. °SEEK IMMEDIATE MEDICAL CARE IF:  °· You have continuous or severe belly (abdominal) pain which is not relieved with medications. °· You develop nausea and vomiting which is not relieved with medications. °· You have symptoms of biliary colic and you suddenly develop a fever and shaking chills. This may signal cholecystitis. Call your caregiver immediately. °· You develop a yellow color to your skin or the white part of your eyes (jaundice). °Document Released: 02/11/2006 Document Revised: 12/03/2011 Document Reviewed: 04/22/2008 °ExitCare® Patient Information ©2015 [employer]. This information is not intended to replace advice given to you by your health care provider. Make sure you discuss any questions you have with your health care provider. ° °Cholelithiasis °Cholelithiasis (also called gallstones) is a form of gallbladder disease in which gallstones form in your gallbladder. The gallbladder is an organ that stores bile made in the liver, which helps digest fats. Gallstones begin as small crystals and slowly grow into stones. Gallstone pain occurs when the gallbladder spasms and a gallstone is blocking the duct. Pain can also occur when a stone passes out of the duct.  °RISK FACTORS °· Being female.   °· Having multiple pregnancies. Health care providers sometimes advise removing diseased gallbladders before future pregnancies.   °· Being obese. °· Eating a diet heavy in fried foods and fat.   °· Being older than 60 years and increasing age.   °· Prolonged use of medicines containing female hormones.   °· Having diabetes mellitus.   °· Rapidly losing weight.   °· Having a family history of gallstones (heredity).   °SYMPTOMS °· Nausea.   °· Vomiting. °· Abdominal pain.   °· Yellowing of the skin (jaundice).   °· Sudden pain. It  may persist from several minutes to several hours. °· Fever.   °· Tenderness to the touch.  °In some cases, when gallstones do not move into the bile duct, people have no pain or symptoms. These are called "silent" gallstones.  °TREATMENT °Silent gallstones do not need treatment. In severe cases, emergency surgery may be required. Options for treatment include: °· Surgery to remove the gallbladder. This is the most common treatment. °· Medicines. These do not always work and may take 6-12 months or more to work. °· Shock wave treatment (extracorporeal biliary lithotripsy). In this treatment an ultrasound machine sends shock waves to the gallbladder to break gallstones into smaller pieces that can pass into the intestines or be dissolved by medicine. °HOME CARE INSTRUCTIONS  °· Only take over-the-counter or prescription medicines for pain, discomfort, or fever as directed by your health care provider.   °· Follow a low-fat diet until seen again by your health care provider. Fat causes the gallbladder to contract, which can result in pain.   °· Follow up with your health care provider as directed. Attacks are almost always recurrent and surgery is usually required for permanent treatment.   °SEEK IMMEDIATE MEDICAL CARE IF:  °· Your pain increases and is   not controlled by medicines.   °· You have a fever or persistent symptoms for more than 2-3 days.   °· You have a fever and your symptoms suddenly get worse.   °· You have persistent nausea and vomiting.   °MAKE SURE YOU:  °· Understand these instructions. °· Will watch your condition. °· Will get help right away if you are not doing well or get worse. °Document Released: 09/06/2005 Document Revised: 05/13/2013 Document Reviewed: 03/04/2013 °ExitCare® Patient Information ©2015 [employer]. This information is not intended to replace advice given to you by your health care provider. Make sure you discuss any questions you have with your health care provider. ° °

## 2014-04-30 NOTE — ED Provider Notes (Signed)
CSN: [id number]     Arrival date & time 04/30/14  1711 History   First MD Initiated Contact with Patient 04/30/14 1812     Chief Complaint  Patient presents with  . Abdominal Pain     (Consider location/radiation/quality/duration/timing/severity/associated sxs/prior Treatment) HPI Comments: Patient presents to the emergency department with chief complaint of right upper quadrant abdominal pain with associated nausea and vomiting. She states that the symptoms started on Wednesday. She is has progressively worsened. She reports vomiting today. She has not tried taking anything to alleviate her symptoms. There no aggravating or alleviating factors. She denies any fevers, chills, chest pain, shortness of breath, diarrhea, constipation, dysuria, or vaginal discharge. She denies any history of abdominal surgeries.  The history is provided by the patient. No language interpreter was used.    Past Medical History  Diagnosis Date  . Allergy    History reviewed. No pertinent past surgical history. Family History  Problem Relation Age of Onset  . Aneurysm Father   . Diabetes Maternal Aunt   . Diabetes Maternal Grandfather   . Fibromyalgia Paternal Grandmother   . Heart disease Paternal Grandfather    History  Substance Use Topics  . Smoking status: Never Smoker   . Smokeless tobacco: Never Used  . Alcohol Use: No   OB History   Grav Para Term Preterm Abortions TAB SAB Ect Mult Living   2 2 2       2      Review of Systems  All other systems reviewed and are negative.     Allergies  Amoxicillin  Home Medications   Prior to Admission medications   Medication Sig Start Date End Date Taking? Authorizing Provider  clindamycin (CLEOCIN) 300 MG capsule Take 1 capsule (300 mg total) by mouth 3 (three) times daily. 03/18/14   Thao P Le, DO  levonorgestrel-ethinyl estradiol (ENPRESSE,TRIVORA) tablet Take 1 tablet by mouth daily. 10/02/12   Nigel BridgemanVicki Latham, CNM   BP 102/57  Pulse 67   Temp(Src) 97.6 F (36.4 C) (Oral)  Resp 16  SpO2 100% Physical Exam  Nursing note and vitals reviewed. Constitutional: She is oriented to person, place, and time. She appears well-developed and well-nourished.  HENT:  Head: Normocephalic and atraumatic.  Eyes: Conjunctivae and EOM are normal. Pupils are equal, round, and reactive to light.  Neck: Normal range of motion. Neck supple.  Cardiovascular: Normal rate and regular rhythm.  Exam reveals no gallop and no friction rub.   No murmur heard. Pulmonary/Chest: Effort normal and breath sounds normal. No respiratory distress. She has no wheezes. She has no rales. She exhibits no tenderness.  Abdominal: Soft. Bowel sounds are normal. She exhibits no distension and no mass. There is tenderness. There is no rebound and no guarding.  Tenderness palpation in the epigastric region, as well as the right upper quadrant, no other focal abdominal tenderness  Musculoskeletal: Normal range of motion. She exhibits no edema and no tenderness.  Neurological: She is alert and oriented to person, place, and time.  Skin: Skin is warm and dry.  Psychiatric: She has a normal mood and affect. Her behavior is normal. Judgment and thought content normal.    ED Course  Procedures (including critical care time) Results for orders placed during the hospital encounter of 04/30/14  COMPREHENSIVE METABOLIC PANEL      Result Value Ref Range   Sodium 139  137 - 147 mEq/L   Potassium 3.9  3.7 - 5.3 mEq/L   Chloride 102  96 - 112 mEq/L   CO2 23  19 - 32 mEq/L   Glucose, Bld 118 (*) 70 - 99 mg/dL   BUN 14  6 - 23 mg/dL   Creatinine, Ser 1.61  0.50 - 1.10 mg/dL   Calcium 9.5  8.4 - 09.6 mg/dL   Total Protein 7.5  6.0 - 8.3 g/dL   Albumin 3.9  3.5 - 5.2 g/dL   AST 34  0 - 37 U/L   ALT 24  0 - 35 U/L   Alkaline Phosphatase 60  39 - 117 U/L   Total Bilirubin 0.9  0.3 - 1.2 mg/dL   GFR calc non Af Amer >90  >90 mL/min   GFR calc Af Amer >90  >90 mL/min   Anion gap  14  5 - 15  CBC WITH DIFFERENTIAL      Result Value Ref Range   WBC 14.3 (*) 4.0 - 10.5 K/uL   RBC 4.39  3.87 - 5.11 MIL/uL   Hemoglobin 12.9  12.0 - 15.0 g/dL   HCT 04.5  40.9 - 81.1 %   MCV 87.2  78.0 - 100.0 fL   MCH 29.4  26.0 - 34.0 pg   MCHC 33.7  30.0 - 36.0 g/dL   RDW 91.4  78.2 - 95.6 %   Platelets 258  150 - 400 K/uL   Neutrophils Relative % 87 (*) 43 - 77 %   Neutro Abs 12.5 (*) 1.7 - 7.7 K/uL   Lymphocytes Relative 7 (*) 12 - 46 %   Lymphs Abs 1.0  0.7 - 4.0 K/uL   Monocytes Relative 6  3 - 12 %   Monocytes Absolute 0.8  0.1 - 1.0 K/uL   Eosinophils Relative 0  0 - 5 %   Eosinophils Absolute 0.0  0.0 - 0.7 K/uL   Basophils Relative 0  0 - 1 %   Basophils Absolute 0.0  0.0 - 0.1 K/uL  LIPASE, BLOOD      Result Value Ref Range   Lipase 47  11 - 59 U/L  URINALYSIS, ROUTINE W REFLEX MICROSCOPIC      Result Value Ref Range   Color, Urine ORANGE (*) YELLOW   APPearance CLOUDY (*) CLEAR   Specific Gravity, Urine 1.031 (*) 1.005 - 1.030   pH 7.0  5.0 - 8.0   Glucose, UA NEGATIVE  NEGATIVE mg/dL   Hgb urine dipstick TRACE (*) NEGATIVE   Bilirubin Urine SMALL (*) NEGATIVE   Ketones, ur 15 (*) NEGATIVE mg/dL   Protein, ur 213 (*) NEGATIVE mg/dL   Urobilinogen, UA 1.0  0.0 - 1.0 mg/dL   Nitrite NEGATIVE  NEGATIVE   Leukocytes, UA MODERATE (*) NEGATIVE  URINE MICROSCOPIC-ADD ON      Result Value Ref Range   Squamous Epithelial / LPF MANY (*) RARE   WBC, UA 7-10  <3 WBC/hpf   Bacteria, UA FEW (*) RARE   Urine-Other MUCOUS PRESENT    POC URINE PREG, ED      Result Value Ref Range   Preg Test, Ur NEGATIVE  NEGATIVE   US Abdomen Complete  04/30/2014   CLINICAL DATA:  Right upper quadrant pain with nausea and vomiting  EXAM: ULTRASOUND ABDOMEN COMPLETE  COMPARISON:  None.  FINDINGS: Gallbladder:  There is sludge within the gallbladder lumen and there are multiple mobile calculi measuring between 4 and 7 mm. There is no wall thickening. There is no sonographic Murphy's sign.   Common bile duct:  Diameter: 4  mm  Liver:  No focal lesion identified. Within normal limits in parenchymal echogenicity.  IVC:  No abnormality visualized.  Pancreas:  Visualized portion unremarkable.  Evaluation limited.  Spleen:  Size and appearance within normal limits.  Right Kidney:  Length: 11.0 cm. Echogenicity within normal limits. No mass or hydronephrosis visualized.  Left Kidney:  Length: 12.2 cm. Echogenicity within normal limits. No mass or hydronephrosis visualized.  Abdominal aorta:  No aneurysm visualized.  Other findings:  None.  IMPRESSION: Cholelithiasis with no sonographic Murphy sign.   Electronically Signed   By: Esperanza Heir M.D.   On: 04/30/2014 20:05     Imaging Review No results found.   EKG Interpretation None      MDM   Final diagnoses:  Calculus of gallbladder without cholecystitis without obstruction    Patient with right upper quadrant tenderness, and nausea and vomiting. Will check labs, will reassess. Patient has a nonspecific leukocytosis, believe this to be related to her vomiting. There is no evidence of cholecystitis on ultrasound, but the patient does have several gallstones. Will treat her for gallbladder colic, with pain medicine, and Zofran. Recommend surgical followup. Patient is tolerating oral intake currently. She feels well enough to go home. Urine specimen is dirty, the patient denies any dysuria, doubt UTI.  Patient instructed to return for:  New or worsening symptoms, including, increased abdominal pain, especially pain that localizes to one side, bloody vomit, bloody diarrhea, fever >101, and intractable vomiting.  Patient seen by and discussed with Dr. Romeo Apple, who agrees with the plan.    Roxy Horseman, PA-C 04/30/14 2122

## 2014-04-30 NOTE — ED Notes (Signed)
Pt reports abdominal pain and vomiting that has progressively gotten worse, pain 8/10 at present. Vomiting 1x today.

## 2014-04-30 NOTE — ED Notes (Signed)
Pt tolerating fluids well. 

## 2014-05-17 ENCOUNTER — Encounter (INDEPENDENT_AMBULATORY_CARE_PROVIDER_SITE_OTHER): Payer: Self-pay | Admitting: Surgery

## 2014-05-17 ENCOUNTER — Ambulatory Visit (INDEPENDENT_AMBULATORY_CARE_PROVIDER_SITE_OTHER): Payer: BC Managed Care – PPO | Admitting: Surgery

## 2014-05-17 VITALS — BP 110/60 | HR 72 | Resp 16 | Ht 64.0 in | Wt 127.0 lb

## 2014-05-17 DIAGNOSIS — K8010 Calculus of gallbladder with chronic cholecystitis without obstruction: Secondary | ICD-10-CM | POA: Insufficient documentation

## 2014-05-17 NOTE — Patient Instructions (Signed)
Please consider the recommendations that we have given you today:  Consider surgery to laparoscopically remove her gallbladder and prevent further attacks  See the Handout(s) we have given you.  Please call our office at [phone] if you wish to schedule surgery or if you have further questions / concerns.   Cholecystitis Cholecystitis is an inflammation of your gallbladder. It is usually caused by a buildup of gallstones or sludge (cholelithiasis) in your gallbladder. The gallbladder stores a fluid that helps digest fats (bile). Cholecystitis is serious and needs treatment right away.  CAUSES   Gallstones. Gallstones can block the tube that leads to your gallbladder, causing bile to build up. As bile builds up, the gallbladder becomes inflamed.  Bile duct problems, such as blockage from scarring or kinking.  Tumors. Tumors can stop bile from leaving your gallbladder correctly, causing bile to build up. As bile builds up, the gallbladder becomes inflamed. SYMPTOMS   Nausea.  Vomiting.  Abdominal pain, especially in the upper right area of your abdomen.  Abdominal tenderness or bloating.  Sweating.  Chills.  Fever.  Yellowing of the skin and the whites of the eyes (jaundice). DIAGNOSIS  Your caregiver may order blood tests to look for infection or gallbladder problems. Your caregiver may also order imaging tests, such as an ultrasound or computed tomography (CT) scan. Further tests may include a hepatobiliary iminodiacetic acid (HIDA) scan. This scan allows your caregiver to see your bile move from the liver to the gallbladder and to the small intestine. TREATMENT  A hospital stay is usually necessary to lessen the inflammation of your gallbladder. You may be required to not eat or drink (fast) for a certain amount of time. You may be given medicine to treat pain or an antibiotic medicine to treat an infection. Surgery may be needed to remove your gallbladder  (cholecystectomy) once the inflammation has gone down. Surgery may be needed right away if you develop complications such as death of gallbladder tissue (gangrene) or a tear (perforation) of the gallbladder.  HOME CARE INSTRUCTIONS  Home care will depend on your treatment. In general:  If you were given antibiotics, take them as directed. Finish them even if you start to feel better.  Only take over-the-counter or prescription medicines for pain, discomfort, or fever as directed by your caregiver.  Follow a low-fat diet until you see your caregiver again.  Keep all follow-up visits as directed by your caregiver. SEEK IMMEDIATE MEDICAL CARE IF:   Your pain is increasing and not controlled by medicines.  Your pain moves to another part of your abdomen or to your back.  You have a fever.  You have nausea and vomiting. MAKE SURE YOU:  Understand these instructions.  Will watch your condition.  Will get help right away if you are not doing well or get worse. Document Released: 09/10/2005 Document Revised: 12/03/2011 Document Reviewed: 07/27/2011 [facility] Patient Information 2015 [place], [place]. This information is not intended to replace advice given to you by your health care provider. Make sure you discuss any questions you have with your health care provider.  LAPAROSCOPIC SURGERY: POST OP INSTRUCTIONS  1. DIET: Follow a light bland diet the first 24 hours after arrival home, such as soup, liquids, crackers, etc.  Be sure to include lots of fluids daily.  Avoid fast food or heavy meals as your are more likely to get nauseated.  Eat a low fat the next few days after surgery.   2. Take your usually prescribed  home medications unless otherwise directed. 3. PAIN CONTROL: a. Pain is best controlled by a usual combination of three different methods TOGETHER: i. Ice/Heat ii. Over the counter pain medication iii. Prescription pain medication b. Most patients will experience some  swelling and bruising around the incisions.  Ice packs or heating pads (30-60 minutes up to 6 times a day) will help. Use ice for the first few days to help decrease swelling and bruising, then switch to heat to help relax tight/sore spots and speed recovery.  Some people prefer to use ice alone, heat alone, alternating between ice & heat.  Experiment to what works for you.  Swelling and bruising can take several weeks to resolve.   c. It is helpful to take an over-the-counter pain medication regularly for the first few weeks.  Choose one of the following that works best for you: i. Naproxen (Aleve, etc)  Two  tabs twice a day ii. Ibuprofen (Advil, etc) Three  tabs four times a day (every meal & bedtime) iii. Acetaminophen (Tylenol, etc) 500-650mg  four times a day (every meal & bedtime) d. A  prescription for pain medication (such as oxycodone, hydrocodone, etc) should be given to you upon discharge.  Take your pain medication as prescribed.  i. If you are having problems/concerns with the prescription medicine (does not control pain, nausea, vomiting, rash, itching, etc), please call us [phone] to see if we need to switch you to a different pain medicine that will work better for you and/or control your side effect better. ii. If you need a refill on your pain medication, please contact your pharmacy.  They will contact our office to request authorization. Prescriptions will not be filled after 5 pm or on week-ends. 4. Avoid getting constipated.  Between the surgery and the pain medications, it is common to experience some constipation.  Increasing fluid intake and taking a fiber supplement (such as Metamucil, Citrucel, FiberCon, MiraLax, etc) 1-2 times a day regularly will usually help prevent this problem from occurring.  A mild laxative (prune juice, Milk of Magnesia, MiraLax, etc) should be taken according to package directions if there are no bowel movements after 48 hours.   5. Watch  out for diarrhea.  If you have many loose bowel movements, simplify your diet to bland foods & liquids for a few days.  Stop any stool softeners and decrease your fiber supplement.  Switching to mild anti-diarrheal medications (Kayopectate, Pepto Bismol) can help.  If this worsens or does not improve, please call us. 6. Wash / shower every day.  You may shower over the dressings as they are waterproof.  Continue to shower over incision(s) after the dressing is off. 7. Remove your waterproof bandages 5 days after surgery.  You may leave the incision open to air.  You may replace a dressing/Band-Aid to cover the incision for comfort if you wish.  8. ACTIVITIES as tolerated:   a. You may resume regular (light) daily activities beginning the next day-such as daily self-care, walking, climbing stairs-gradually increasing activities as tolerated.  If you can walk 30 minutes without difficulty, it is safe to try more intense activity such as jogging, treadmill, bicycling, low-impact aerobics, swimming, etc. b. Save the most intensive and strenuous activity for last such as sit-ups, heavy lifting, contact sports, etc  Refrain from any heavy lifting or straining until you are off narcotics for pain control.   c. DO NOT PUSH THROUGH PAIN.  Let pain be your guide: If it hurts  to do something, don't do it.  Pain is your body warning you to avoid that activity for another week until the pain goes down. d. You may drive when you are no longer taking prescription pain medication, you can comfortably wear a seatbelt, and you can safely maneuver your car and apply brakes. e. Bonita Quin may have sexual intercourse when it is comfortable.  9. FOLLOW UP in our office a. Please call [facility] at [phone] to set up an appointment to see your surgeon in the office for a follow-up appointment approximately 2-3 weeks after your surgery. b. Make sure that you call for this appointment the day you arrive home to insure a convenient  appointment time. 10. IF YOU HAVE DISABILITY OR FAMILY LEAVE FORMS, BRING THEM TO THE OFFICE FOR PROCESSING.  DO NOT GIVE THEM TO YOUR DOCTOR.   WHEN TO CALL us [phone]: 1. Poor pain control 2. Reactions / problems with new medications (rash/itching, nausea, etc)  3. Fever over 101.5 F (38.5 C) 4. Inability to urinate 5. Nausea and/or vomiting 6. Worsening swelling or bruising 7. Continued bleeding from incision. 8. Increased pain, redness, or drainage from the incision   The clinic staff is available to answer your questions during regular business hours (8:30am-5pm).  Please don't hesitate to call and ask to speak to one of our nurses for clinical concerns.   If you have a medical emergency, go to the nearest emergency room or call 911.  A surgeon from [facility] is always on call at the [facility], [place] [address], [address], [place], [place]  [address] ? MAIN: [phone] ? TOLL FREE: [phone] ?  FAX [phone] [email or web address]  Managing Pain  Pain after surgery or related to activity is often due to strain/injury to muscle, tendon, nerves and/or incisions.  This pain is usually short-term and will improve in a few months.   Many people find it helpful to do the following things TOGETHER to help speed the process of healing and to get back to regular activity more quickly:  1. Avoid heavy physical activity a.  no lifting greater than 20 pounds b. Do not "push through" the pain.  Listen to your body and avoid positions and maneuvers than reproduce the pain c. Walking is okay as tolerated, but go slowly and stop when getting sore.  d. Remember: If it hurts to do it, then don't do it! 2. Take Anti-inflammatory medication  a. Take with food/snack around the clock for 1-2 weeks i. This helps the muscle and nerve tissues become less irritable and calm down faster b. Choose ONE of the following  over-the-counter medications: i. Naproxen  tabs (ex. Aleve) 1-2 pills twice a day  ii. Ibuprofen  tabs (ex. Advil, Motrin) 3-4 pills with every meal and just before bedtime iii. Acetaminophen  tabs (Tylenol) 1-2 pills with every meal and just before bedtime 3. Use a Heating pad or Ice/Cold Pack a. 4-6 times a day b. May use warm bath/hottub  or showers 4. Try Gentle Massage and/or Stretching  a. at the area of pain many times a day b. stop if you feel pain - do not overdo it  Try these steps together to help you body heal faster and avoid making things get worse.  Doing just one of these things may not be enough.    If you are not getting better after two weeks or are noticing you are getting worse,  contact our office for further advice; we may need to re-evaluate you & see what other things we can do to help.  GETTING TO GOOD BOWEL HEALTH. Irregular bowel habits such as constipation and diarrhea can lead to many problems over time.  Having one soft bowel movement a day is the most important way to prevent further problems.  The anorectal canal is designed to handle stretching and feces to safely manage our ability to get rid of solid waste (feces, poop, stool) out of our body.  BUT, hard constipated stools can act like ripping concrete bricks and diarrhea can be a burning fire to this very sensitive area of our body, causing inflamed hemorrhoids, anal fissures, increasing risk is perirectal abscesses, abdominal pain/bloating, an making irritable bowel worse.     The goal: ONE SOFT BOWEL MOVEMENT A DAY!  To have soft, regular bowel movements:    Drink at least 8 tall glasses of water a day.     Take plenty of fiber.  Fiber is the undigested part of plant food that passes into the colon, acting s "natures broom" to encourage bowel motility and movement.  Fiber can absorb and hold large amounts of water. This results in a larger, bulkier stool, which is soft and easier to pass. Work  gradually over several weeks up to 6 servings a day of fiber (25g a day even more if needed) in the form of: o Vegetables -- Root (potatoes, carrots, turnips), leafy green (lettuce, salad greens, celery, spinach), or cooked high residue (cabbage, broccoli, etc) o Fruit -- Fresh (unpeeled skin & pulp), Dried (prunes, apricots, cherries, etc ),  or stewed ( applesauce)  o Whole grain breads, pasta, etc (whole wheat)  o Bran cereals    Bulking Agents -- This type of water-retaining fiber generally is easily obtained each day by one of the following:  o Psyllium bran -- The psyllium plant is remarkable because its ground seeds can retain so much water. This product is available as Metamucil, Konsyl, Effersyllium, Per Diem Fiber, or the less expensive generic preparation in drug and health food stores. Although labeled a laxative, it really is not a laxative.  o Methylcellulose -- This is another fiber derived from wood which also retains water. It is available as Citrucel. o Polyethylene Glycol - and "artificial" fiber commonly called Miralax or Glycolax.  It is helpful for people with gassy or bloated feelings with regular fiber o Flax Seed - a less gassy fiber than psyllium   No reading or other relaxing activity while on the toilet. If bowel movements take longer than 5 minutes, you are too constipated   AVOID CONSTIPATION.  High fiber and water intake usually takes care of this.  Sometimes a laxative is needed to stimulate more frequent bowel movements, but    Laxatives are not a good long-term solution as it can wear the colon out. o Osmotics (Milk of Magnesia, Fleets phosphosoda, Magnesium citrate, MiraLax, GoLytely) are safer than  o Stimulants (Senokot, Castor Oil, Dulcolax, Ex Lax)    o Do not take laxatives for more than 7days in a row.    IF SEVERELY CONSTIPATED, try a Bowel Retraining Program: o Do not use laxatives.  o Eat a diet high in roughage, such as bran cereals and leafy vegetables.   o Drink six (6) ounces of prune or apricot juice each morning.  o Eat two (2) large servings of stewed fruit each day.  o Take one (1) heaping tablespoon of  a psyllium-based bulking agent twice a day. Use sugar-free sweetener when possible to avoid excessive calories.  o Eat a normal breakfast.  o Set aside 15 minutes after breakfast to sit on the toilet, but do not strain to have a bowel movement.  o If you do not have a bowel movement by the third day, use an enema and repeat the above steps.    Controlling diarrhea o Switch to liquids and simpler foods for a few days to avoid stressing your intestines further. o Avoid dairy products (especially milk & ice cream) for a short time.  The intestines often can lose the ability to digest lactose when stressed. o Avoid foods that cause gassiness or bloating.  Typical foods include beans and other legumes, cabbage, broccoli, and dairy foods.  Every person has some sensitivity to other foods, so listen to our body and avoid those foods that trigger problems for you. o Adding fiber (Citrucel, Metamucil, psyllium, Miralax) gradually can help thicken stools by absorbing excess fluid and retrain the intestines to act more normally.  Slowly increase the dose over a few weeks.  Too much fiber too soon can backfire and cause cramping & bloating. o Probiotics (such as active yogurt, Align, etc) may help repopulate the intestines and colon with normal bacteria and calm down a sensitive digestive tract.  Most studies show it to be of mild help, though, and such products can be costly. o Medicines:   Bismuth subsalicylate (ex. Kayopectate, Pepto Bismol) every 30 minutes for up to 6 doses can help control diarrhea.  Avoid if pregnant.   Loperamide (Immodium) can slow down diarrhea.  Start with two tablets (  total) first and then try one tablet every 6 hours.  Avoid if you are having fevers or severe pain.  If you are not better or start feeling worse, stop all  medicines and call your doctor for advice o Call your doctor if you are getting worse or not better.  Sometimes further testing (cultures, endoscopy, X-ray studies, bloodwork, etc) may be needed to help diagnose and treat the cause of the diarrhea.  Viral Gastroenteritis Viral gastroenteritis is also known as stomach flu. This condition affects the stomach and intestinal tract. It can cause sudden diarrhea and vomiting. The illness typically lasts 3 to 8 days. Most people develop an immune response that eventually gets rid of the virus. While this natural response develops, the virus can make you quite ill. CAUSES  Many different viruses can cause gastroenteritis, such as rotavirus or noroviruses. You can catch one of these viruses by consuming contaminated food or water. You may also catch a virus by sharing utensils or other personal items with an infected person or by touching a contaminated surface. SYMPTOMS  The most common symptoms are diarrhea and vomiting. These problems can cause a severe loss of body fluids (dehydration) and a body salt (electrolyte) imbalance. Other symptoms may include:  Fever.  Headache.  Fatigue.  Abdominal pain. DIAGNOSIS  Your caregiver can usually diagnose viral gastroenteritis based on your symptoms and a physical exam. A stool sample may also be taken to test for the presence of viruses or other infections. TREATMENT  This illness typically goes away on its own. Treatments are aimed at rehydration. The most serious cases of viral gastroenteritis involve vomiting so severely that you are not able to keep fluids down. In these cases, fluids must be given through an intravenous line (IV). HOME CARE INSTRUCTIONS   Drink enough fluids to keep  your urine clear or pale yellow. Drink small amounts of fluids frequently and increase the amounts as tolerated.  Ask your caregiver for specific rehydration instructions.  Avoid:  Foods high in  sugar.  Alcohol.  Carbonated drinks.  Tobacco.  Juice.  Caffeine drinks.  Extremely hot or cold fluids.  Fatty, greasy foods.  Too much intake of anything at one time.  Dairy products until 24 to 48 hours after diarrhea stops.  You may consume probiotics. Probiotics are active cultures of beneficial bacteria. They may lessen the amount and number of diarrheal stools in adults. Probiotics can be found in yogurt with active cultures and in supplements.  Wash your hands well to avoid spreading the virus.  Only take over-the-counter or prescription medicines for pain, discomfort, or fever as directed by your caregiver. Do not give aspirin to children. Antidiarrheal medicines are not recommended.  Ask your caregiver if you should continue to take your regular prescribed and over-the-counter medicines.  Keep all follow-up appointments as directed by your caregiver. SEEK IMMEDIATE MEDICAL CARE IF:   You are unable to keep fluids down.  You do not urinate at least once every 6 to 8 hours.  You develop shortness of breath.  You notice blood in your stool or vomit. This may look like coffee grounds.  You have abdominal pain that increases or is concentrated in one small area (localized).  You have persistent vomiting or diarrhea.  You have a fever.  The patient is a child younger than 3 months, and he or she has a fever.  The patient is a child older than 3 months, and he or she has a fever and persistent symptoms.  The patient is a child older than 3 months, and he or she has a fever and symptoms suddenly get worse.  The patient is a baby, and he or she has no tears when crying. MAKE SURE YOU:   Understand these instructions.  Will watch your condition.  Will get help right away if you are not doing well or get worse. Document Released: 09/10/2005 Document Revised: 12/03/2011 Document Reviewed: 06/27/2011 [facility] Patient Information 2015 [place], [place]. This  information is not intended to replace advice given to you by your health care provider. Make sure you discuss any questions you have with your health care provider.

## 2014-05-17 NOTE — Progress Notes (Signed)
Subjective:     Patient ID: Marissa Copeland, female   DOB: [birth year], 33 y.o.   MRN: [id number]  HPI  Note: Portions of this report may have been transcribed using voice recognition software. Every effort was made to ensure accuracy; however, inadvertent computerized transcription errors may be present.   Any transcriptional errors that result from this process are unintentional.            Marissa Copeland  03/30/1981 [id number]  Patient Care Team: Nigel Bridgeman, CNM as PCP - General (Obstetrics and Gynecology) Purvis Sheffield, MD as Attending Physician (Emergency Medicine) Purcell Nails, MD as Consulting Physician (Obstetrics and Gynecology)  This patient is a 33 y.o.female who presents today for surgical evaluation at the request of Dr. Farrel Gordon.   Reason for visit: Nausea vomiting abdominal pain with gallstones.  Pleasant active female.  Head episode of severe nausea vomiting and abdominal pain while at a friend's house.  One the [place] children turned out to be sick the day before.  She wondered if she had gastroenteritis.  She felt normal the next day.  Ate hamburgers at [facility] the next day.  Then felt worse again.  Nauseated.  Pain rather intense and vague.  Then became more focal in the upper abdomen plica band.  Getting recurrent episodes of nausea vomiting and pain when she trying to draink or drink.  No diarrhea.  No one else sick at home.  She blacked out.  Husband brought her to emergency room.  Patient felt better with nausea and pain medicines.  Sharp reproduction of pain with ultrasound.  Gallstones noted.  Acute cholecystitis not definite.  Biliary colic suspected.  Surgical consultation requested.  She denies any more attacks.  Has been sticking to a low-fat diet.  Denies heartburn or reflux.  She normally has a bowel movement every day.  Can walk 30-60 minutes without difficulty.  A normal pregnancies.  Recalls mild heartburn with pregnancies.  This is not like that.   No personal nor family history of GI/colon cancer, inflammatory bowel disease, irritable bowel syndrome, allergy such as Celiac Sprue, dietary/dairy problems, colitis, ulcers nor gastritis.  No travel outside the country.  No changes in diet.  No dysphagia to solids or liquids.  No significant heartburn or reflux.  No hematochezia, hematemesis, coffee ground emesis.  No evidence of prior gastric/peptic ulceration.    Patient Active Problem List   Diagnosis Date Noted  . Chronic cholecystitis with calculus 05/17/2014  . Migraines 10/02/2012  . Anxiety and depression 10/02/2012    Past Medical History  Diagnosis Date  . Allergy     History reviewed. No pertinent past surgical history.  History   Social History  . Marital Status: Married    Spouse Name: N/A    Number of Children: N/A  . Years of Education: N/A   Occupational History  . Not on file.   Social History Main Topics  . Smoking status: Never Smoker   . Smokeless tobacco: Never Used  . Alcohol Use: No  . Drug Use: No  . Sexual Activity: Yes    Partners: Male    Birth Control/ Protection: Pill, Condom     Comment: married    Other Topics Concern  . Not on file   Social History Narrative  . No narrative on file    Family History  Problem Relation Age of Onset  . Aneurysm Father   . Diabetes Maternal Aunt   . Diabetes Maternal Grandfather   .  Fibromyalgia Paternal Grandmother   . Heart disease Paternal Grandfather     Current Outpatient Prescriptions  Medication Sig Dispense Refill  . levonorgestrel-ethinyl estradiol (ENPRESSE,TRIVORA) tablet Take 1 tablet by mouth daily.  1 Package  11  . HYDROcodone-acetaminophen (NORCO/VICODIN) 5-325 MG per tablet Take 1-2 tablets by mouth every 6 (six) hours as needed for moderate pain or severe pain.  15 tablet  0  . ondansetron (ZOFRAN) 4 MG tablet Take 1 tablet (4 mg total) by mouth every 6 (six) hours.  12 tablet  0   No current facility-administered medications  for this visit.     Allergies  Allergen Reactions  . Amoxicillin Rash    BP 110/60  Pulse 72  Resp 16  Ht  (1.626 m)  Wt 127 lb (57.607 kg)  BMI 21.79 kg/m2  US Abdomen Complete  04/30/2014   CLINICAL DATA:  Right upper quadrant pain with nausea and vomiting  EXAM: ULTRASOUND ABDOMEN COMPLETE  COMPARISON:  None.  FINDINGS: Gallbladder:  There is sludge within the gallbladder lumen and there are multiple mobile calculi measuring between 4 and 7 mm. There is no wall thickening. There is no sonographic Murphy's sign.  Common bile duct:  Diameter: 4 mm  Liver:  No focal lesion identified. Within normal limits in parenchymal echogenicity.  IVC:  No abnormality visualized.  Pancreas:  Visualized portion unremarkable.  Evaluation limited.  Spleen:  Size and appearance within normal limits.  Right Kidney:  Length: 11.0 cm. Echogenicity within normal limits. No mass or hydronephrosis visualized.  Left Kidney:  Length: 12.2 cm. Echogenicity within normal limits. No mass or hydronephrosis visualized.  Abdominal aorta:  No aneurysm visualized.  Other findings:  None.  IMPRESSION: Cholelithiasis with no sonographic Murphy sign.   Electronically Signed   By: Esperanza Heir M.D.   On: 04/30/2014 20:05     Review of Systems  Constitutional: Negative for fever, chills, diaphoresis, appetite change and fatigue.  HENT: Negative for ear discharge, ear pain, sore throat and trouble swallowing.   Eyes: Negative for photophobia, discharge and visual disturbance.  Respiratory: Negative for cough, choking, chest tightness and shortness of breath.   Cardiovascular: Negative for chest pain and palpitations.  Gastrointestinal: Positive for nausea and abdominal pain. Negative for vomiting, diarrhea, constipation, anal bleeding and rectal pain.  Endocrine: Negative for cold intolerance and heat intolerance.  Genitourinary: Negative for dysuria, frequency and difficulty urinating.  Musculoskeletal: Negative for  gait problem, myalgias and neck pain.  Skin: Negative for color change, pallor and rash.  Allergic/Immunologic: Negative for environmental allergies, food allergies and immunocompromised state.  Neurological: Negative for dizziness, speech difficulty, weakness and numbness.  Hematological: Negative for adenopathy.  Psychiatric/Behavioral: Negative for confusion and agitation. The patient is not nervous/anxious.        Objective:   Physical Exam  Constitutional: She is oriented to person, place, and time. She appears well-developed and well-nourished. No distress.  HENT:  Head: Normocephalic.  Mouth/Throat: Oropharynx is clear and moist. No oropharyngeal exudate.  Eyes: Conjunctivae and EOM are normal. Pupils are equal, round, and reactive to light. No scleral icterus.  Neck: Normal range of motion. Neck supple. No tracheal deviation present.  Cardiovascular: Normal rate, regular rhythm and intact distal pulses.   Pulmonary/Chest: Effort normal and breath sounds normal. No stridor. No respiratory distress. She exhibits no tenderness.  Abdominal: Soft. She exhibits no distension and no mass. There is no tenderness. Hernia confirmed negative in the right inguinal area and confirmed negative  in the left inguinal area.  Genitourinary: No vaginal discharge found.  Musculoskeletal: Normal range of motion. She exhibits no tenderness.       Right elbow: She exhibits normal range of motion.       Left elbow: She exhibits normal range of motion.       Right wrist: She exhibits normal range of motion.       Left wrist: She exhibits normal range of motion.       Right hand: Normal strength noted.       Left hand: Normal strength noted.  Lymphadenopathy:       Head (right side): No posterior auricular adenopathy present.       Head (left side): No posterior auricular adenopathy present.    She has no cervical adenopathy.    She has no axillary adenopathy.       Right: No inguinal adenopathy  present.       Left: No inguinal adenopathy present.  Neurological: She is alert and oriented to person, place, and time. No cranial nerve deficit. She exhibits normal muscle tone. Coordination normal.  Skin: Skin is warm and dry. No rash noted. She is not diaphoretic. No erythema.  Psychiatric: She has a normal mood and affect. Her behavior is normal. Judgment and thought content normal.       Assessment:     Nausea vomiting and abdominal pain recurrent suspicious for cholecystitis.  Intermittent nature makes gastroenteritis less likely.    Plan:     I think she would benefit from cholecystectomy.  Reasonable laparoscopic single site approach.  Another option is to wait until she gets another attack to more certainly verify that it was not gastroenteritis.  However,  She never wants to go ahead again and wishes to proceed with cholecystectomy.  I discussed with her:  The anatomy & physiology of hepatobiliary & pancreatic function was discussed.  The pathophysiology of gallbladder dysfunction was discussed.  Natural history risks without surgery was discussed.   I feel the risks of no intervention will lead to serious problems that outweigh the operative risks; therefore, I recommended cholecystectomy to remove the pathology.  I explained laparoscopic techniques with possible need for an open approach.  Probable cholangiogram to evaluate the bilary tract was explained as well.    Risks such as bleeding, infection, abscess, leak, injury to other organs, need for further treatment, heart attack, death, and other risks were discussed.  I noted a good likelihood this will help address the problem.  Possibility that this will not correct all abdominal symptoms was explained.  Goals of post-operative recovery were discussed as well.  We will work to minimize complications.  An educational handout further explaining the pathology and treatment options was given as well.  Questions were answered.  The  patient expresses understanding & wishes to proceed with surgery.

## 2014-07-26 ENCOUNTER — Encounter (INDEPENDENT_AMBULATORY_CARE_PROVIDER_SITE_OTHER): Payer: Self-pay | Admitting: Surgery

## 2015-11-26 ENCOUNTER — Emergency Department (HOSPITAL_COMMUNITY)
Admission: EM | Admit: 2015-11-26 | Discharge: 2015-11-26 | Disposition: A | Discharge status: Home / Self Care | Payer: BLUE CROSS/BLUE SHIELD | Attending: Emergency Medicine | Admitting: Emergency Medicine

## 2015-11-26 ENCOUNTER — Emergency Department (HOSPITAL_COMMUNITY): Payer: BLUE CROSS/BLUE SHIELD

## 2015-11-26 ENCOUNTER — Encounter (HOSPITAL_COMMUNITY): Payer: Self-pay

## 2015-11-26 DIAGNOSIS — R1032 Left lower quadrant pain: Secondary | ICD-10-CM | POA: Diagnosis present

## 2015-11-26 DIAGNOSIS — K5900 Constipation, unspecified: Secondary | ICD-10-CM | POA: Insufficient documentation

## 2015-11-26 DIAGNOSIS — Z79818 Long term (current) use of other agents affecting estrogen receptors and estrogen levels: Secondary | ICD-10-CM | POA: Diagnosis not present

## 2015-11-26 DIAGNOSIS — Z79899 Other long term (current) drug therapy: Secondary | ICD-10-CM | POA: Diagnosis not present

## 2015-11-26 DIAGNOSIS — Z3202 Encounter for pregnancy test, result negative: Secondary | ICD-10-CM | POA: Diagnosis not present

## 2015-11-26 LAB — URINALYSIS, ROUTINE W REFLEX MICROSCOPIC
Bilirubin Urine: NEGATIVE
GLUCOSE, UA: NEGATIVE mg/dL
Ketones, ur: NEGATIVE mg/dL
Nitrite: NEGATIVE
PROTEIN: NEGATIVE mg/dL
SPECIFIC GRAVITY, URINE: 1.02 (ref 1.005–1.030)
pH: 6 (ref 5.0–8.0)

## 2015-11-26 LAB — CBC
HCT: 37.9 % (ref 36.0–46.0)
Hemoglobin: 12.7 g/dL (ref 12.0–15.0)
MCH: 30.5 pg (ref 26.0–34.0)
MCHC: 33.5 g/dL (ref 30.0–36.0)
MCV: 90.9 fL (ref 78.0–100.0)
PLATELETS: 218 10*3/uL (ref 150–400)
RBC: 4.17 MIL/uL (ref 3.87–5.11)
RDW: 13.3 % (ref 11.5–15.5)
WBC: 7.5 10*3/uL (ref 4.0–10.5)

## 2015-11-26 LAB — URINE MICROSCOPIC-ADD ON

## 2015-11-26 LAB — COMPREHENSIVE METABOLIC PANEL
ALBUMIN: 3.7 g/dL (ref 3.5–5.0)
ALK PHOS: 42 U/L (ref 38–126)
ALT: 16 U/L (ref 14–54)
AST: 19 U/L (ref 15–41)
Anion gap: 8 (ref 5–15)
BUN: 11 mg/dL (ref 6–20)
CALCIUM: 8.6 mg/dL — AB (ref 8.9–10.3)
CO2: 23 mmol/L (ref 22–32)
CREATININE: 0.65 mg/dL (ref 0.44–1.00)
Chloride: 107 mmol/L (ref 101–111)
GFR calc Af Amer: >60 mL/min (ref >60)
GFR calc non Af Amer: >60 mL/min (ref >60)
GLUCOSE: 89 mg/dL (ref 65–99)
Potassium: 3.7 mmol/L (ref 3.5–5.1)
SODIUM: 138 mmol/L (ref 135–145)
Total Bilirubin: 0.4 mg/dL (ref 0.3–1.2)
Total Protein: 6.5 g/dL (ref 6.5–8.1)

## 2015-11-26 LAB — LIPASE, BLOOD: Lipase: 29 U/L (ref 11–51)

## 2015-11-26 LAB — POC URINE PREG, ED: PREG TEST UR: NEGATIVE

## 2015-11-26 NOTE — ED Notes (Signed)
She c/o left flank pain started this morning after voiding.  She is in no distress.  She took a hydrocodone before arrival and tells us she is feeling a bit better.

## 2015-11-26 NOTE — Discharge Instructions (Signed)
Your abdominal pain is most likely due to fecal loading today. If you find yourself constipated, take miralax/polyethylene glycol power as needed. We did not find anything serious on your CT or blood work. Return for worsening symptoms, including fever, vomiting and unable to keep down food/fluids, worsening pain,or any other symptoms concerning to you.  Abdominal Pain, Adult Many things can cause belly (abdominal) pain. Most times, the belly pain is not dangerous. Many cases of belly pain can be watched and treated at home. HOME CARE   Do not take medicines that help you go poop (laxatives) unless told to by your doctor.  Only take medicine as told by your doctor.  Eat or drink as told by your doctor. Your doctor will tell you if you should be on a special diet. GET HELP IF:  You do not know what is causing your belly pain.  You have belly pain while you are sick to your stomach (nauseous) or have runny poop (diarrhea).  You have pain while you pee or poop.  Your belly pain wakes you up at night.  You have belly pain that gets worse or better when you eat.  You have belly pain that gets worse when you eat fatty foods.  You have a fever. GET HELP RIGHT AWAY IF:   The pain does not go away within 2 hours.  You keep throwing up (vomiting).  The pain changes and is only in the right or left part of the belly.  You have bloody or tarry looking poop. MAKE SURE YOU:   Understand these instructions.  Will watch your condition.  Will get help right away if you are not doing well or get worse.   This information is not intended to replace advice given to you by your health care provider. Make sure you discuss any questions you have with your health care provider.   Document Released: 02/27/2008 Document Revised: 10/01/2014 Document Reviewed: 05/20/2013 Elsevier Interactive Patient Education [employer]2016 Elsevier Inc.

## 2015-11-26 NOTE — ED Provider Notes (Signed)
CSN: [id number]     Arrival date & time 11/26/15  0846 History   First MD Initiated Contact with Patient 11/26/15 [phone]     Chief Complaint  Patient presents with  . Flank Pain     (Consider location/radiation/quality/duration/timing/severity/associated sxs/prior Treatment) HPI  35 year old female with history of cholelithiasis who presents with left abdominal pain. Says that she woke up in the night with  Sharp left lower quadrant abdominal pain. It was also associated with left flank pain in urinary urgency. Took a hydrocodone that was given for her cholelithiasis, and says that pain subsequently improved. No fevers, chills, nausea or vomiting, diarrhea, constipation, dysuria, urinary frequency, hematuria, vaginal bleeding or discharge. Has not had similar pain in the past.  Past Medical History  Diagnosis Date  . Allergy    History reviewed. No pertinent past surgical history. Family History  Problem Relation Age of Onset  . Aneurysm Father   . Diabetes Maternal Aunt   . Diabetes Maternal Grandfather   . Fibromyalgia Paternal Grandmother   . Heart disease Paternal Grandfather    Social History  Substance Use Topics  . Smoking status: Never Smoker   . Smokeless tobacco: Never Used  . Alcohol Use: No   OB History    Gravida Para Term Preterm AB TAB SAB Ectopic Multiple Living   2 2 2       2      Review of Systems 10/14 systems reviewed and are negative other than those stated in the HPI    Allergies  Amoxicillin  Home Medications   Prior to Admission medications   Medication Sig Start Date End Date Taking? Authorizing Provider  Cyanocobalamin (B-12 PO) Take 1 tablet by mouth daily.   Yes Historical Provider, MD  Homeopathic Products (COLD RELIEF PO) Take 2 capsules by mouth 2 (two) times daily as needed (for cold).   Yes Historical Provider, MD  levonorgestrel-ethinyl estradiol (ENPRESSE,TRIVORA) tablet Take 1 tablet by mouth daily. 10/02/12  Yes Nigel BridgemanVicki Latham, CNM   loratadine-pseudoephedrine (CLARITIN-D 12-HOUR) 5-120 MG tablet Take 1 tablet by mouth once.   Yes Historical Provider, MD  HYDROcodone-acetaminophen (NORCO/VICODIN) 5-325 MG per tablet Take 1-2 tablets by mouth every 6 (six) hours as needed for moderate pain or severe pain. Patient not taking: Reported on 11/26/2015 04/30/14   Roxy Horsemanobert Browning, PA-C  ondansetron (ZOFRAN) 4 MG tablet Take 1 tablet (4 mg total) by mouth every 6 (six) hours. Patient not taking: Reported on 11/26/2015 04/30/14   Roxy Horsemanobert Browning, PA-C   BP 110/83 mmHg  Pulse 87  Temp(Src) 98.3 F (36.8 C) (Oral)  Resp 16  SpO2 100%  LMP 10/29/2015 (Approximate) Physical Exam Physical Exam  Nursing note and vitals reviewed. Constitutional: Well developed, well nourished, non-toxic, and in no acute distress Head: Normocephalic and atraumatic.  Mouth/Throat: Oropharynx is clear and moist.  Neck: Normal range of motion. Neck supple.  Cardiovascular: Normal rate and regular rhythm.   Pulmonary/Chest: Effort normal and breath sounds normal.  Abdominal: Soft. There is minimal LLQ tenderness. There is no rebound and no guarding. No CVA tenderness. Musculoskeletal: Normal range of motion.  Neurological: Alert, no facial droop, fluent speech, moves all extremities symmetrically Skin: Skin is warm and dry.  Psychiatric: Cooperative  ED Course  Procedures (including critical care time) Labs Review Labs Reviewed  COMPREHENSIVE METABOLIC PANEL - Abnormal; Notable for the following:    Calcium 8.6 (*)    All other components within normal limits  URINALYSIS, ROUTINE W REFLEX MICROSCOPIC (NOT AT  [facility]) - Abnormal; Notable for the following:    APPearance CLOUDY (*)    Hgb urine dipstick SMALL (*)    Leukocytes, UA SMALL (*)    All other components within normal limits  URINE MICROSCOPIC-ADD ON - Abnormal; Notable for the following:    Squamous Epithelial / LPF 0-5 (*)    Bacteria, UA MANY (*)    All other components within normal  limits  LIPASE, BLOOD  CBC  POC URINE PREG, ED    Imaging Review Ct Renal Stone Study  11/26/2015  CLINICAL DATA:  Left flank pain since this morning, left lower quadrant abdominal pain, sudden onset EXAM: CT ABDOMEN AND PELVIS WITHOUT CONTRAST TECHNIQUE: Multidetector CT imaging of the abdomen and pelvis was performed following the standard protocol without IV contrast. COMPARISON:  04/30/2014 FINDINGS: Lower chest:  Mild bilateral dependent atelectasis Hepatobiliary: Numerous tiny gallstones with no pericholecystic fluid or inflammation evident. Pancreas: Normal Spleen: Normal Adrenals/Urinary Tract: Normal Stomach/Bowel: Stomach and small bowel are normal. There is stool throughout much of the large bowel. Appendix is normal. Vascular/Lymphatic: No vascular abnormalities. Numerous periaortic retroperitoneal lymph nodes of borderline size largest measuring about 6 mm in diameter. Reproductive: Negative Other: No free fluid in the abdomen or pelvis Musculoskeletal: Negative IMPRESSION: 1. Known cholelithiasis 2. Moderate fecal retention throughout the colon Electronically Signed   By: Esperanza Heir M.D.   On: 11/26/2015 10:53   I have personally reviewed and evaluated these images and lab results as part of my medical decision-making.   EKG Interpretation None      MDM   Final diagnoses:  Left lower quadrant pain  Constipation, unspecified constipation type    35 year old female with history of cholelithiasis who presents with left flank pain and left lower quadrant abdominal pain. By the time of arrival, she says that her pain has been unremitting, and she only has minimal left lower quadrant abdominal pain without CVA tenderness. Her abdomen is overall soft and non-surgical. Vital signs are non-concerning. Basic blood work including CBC, come into metabolic panel, lipase, urinalysis and urine pregnancy are non-concerning. CT negative for kidney stone or other acute processes. Does show  fecal loading, and discussed miralax for constipation. Strict return and follow-up instructions reviewed. She expressed understanding of all discharge instructions and felt comfortable with the plan of care.     Lavera Guise, MD 11/26/15 1113

## 2015-11-26 NOTE — ED Notes (Signed)
RN Landis GandyIkrame starting IV currently

## 2016-04-12 ENCOUNTER — Ambulatory Visit (INDEPENDENT_AMBULATORY_CARE_PROVIDER_SITE_OTHER): Payer: BLUE CROSS/BLUE SHIELD | Admitting: Physician Assistant

## 2016-04-12 VITALS — BP 114/68 | HR 97 | Temp 98.2°F | Resp 18 | Ht 64.0 in | Wt 129.0 lb

## 2016-04-12 DIAGNOSIS — J029 Acute pharyngitis, unspecified: Secondary | ICD-10-CM

## 2016-04-12 LAB — POCT RAPID STREP A (OFFICE): Rapid Strep A Screen: NEGATIVE

## 2016-04-12 NOTE — Patient Instructions (Addendum)
-I recommend you rest, drink plenty of fluids, eat light meals including soups.  - You may also use Tylenol or ibuprofen over-the-counter for your sore throat.  - Please let me know if you are not seeing any improvement or get worse in 5-7 days -Pick up OTC Flonase for congestion   Pharyngitis Pharyngitis is redness, pain, and swelling (inflammation) of your pharynx.  CAUSES  Pharyngitis is usually caused by infection. Most of the time, these infections are from viruses (viral) and are part of a cold. However, sometimes pharyngitis is caused by bacteria (bacterial). Pharyngitis can also be caused by allergies. Viral pharyngitis may be spread from person to person by coughing, sneezing, and personal items or utensils (cups, forks, spoons, toothbrushes). Bacterial pharyngitis may be spread from person to person by more intimate contact, such as kissing.  SIGNS AND SYMPTOMS  Symptoms of pharyngitis include:   Sore throat.   Tiredness (fatigue).   Low-grade fever.   Headache.  Joint pain and muscle aches.  Skin rashes.  Swollen lymph nodes.  Plaque-like film on throat or tonsils (often seen with bacterial pharyngitis). DIAGNOSIS  Your health care provider will ask you questions about your illness and your symptoms. Your medical history, along with a physical exam, is often all that is needed to diagnose pharyngitis. Sometimes, a rapid strep test is done. Other lab tests may also be done, depending on the suspected cause.  TREATMENT  Viral pharyngitis will usually get better in 3-4 days without the use of medicine. Bacterial pharyngitis is treated with medicines that kill germs (antibiotics).  HOME CARE INSTRUCTIONS   Drink enough water and fluids to keep your urine clear or pale yellow.   Only take over-the-counter or prescription medicines as directed by your health care provider:   If you are prescribed antibiotics, make sure you finish them even if you start to feel  better.   Do not take aspirin.   Get lots of rest.   Gargle with 8 oz of salt water ( tsp of salt per 1 qt of water) as often as every 1-2 hours to soothe your throat.   Throat lozenges (if you are not at risk for choking) or sprays may be used to soothe your throat. SEEK MEDICAL CARE IF:   You have large, tender lumps in your neck.  You have a rash.  You cough up green, yellow-brown, or bloody spit. SEEK IMMEDIATE MEDICAL CARE IF:   Your neck becomes stiff.  You drool or are unable to swallow liquids.  You vomit or are unable to keep medicines or liquids down.  You have severe pain that does not go away with the use of recommended medicines.  You have trouble breathing (not caused by a stuffy nose). MAKE SURE YOU:   Understand these instructions.  Will watch your condition.  Will get help right away if you are not doing well or get worse.   This information is not intended to replace advice given to you by your health care provider. Make sure you discuss any questions you have with your health care provider.   Document Released: 09/10/2005 Document Revised: 07/01/2013 Document Reviewed: 05/18/2013 Elsevier Interactive Patient Education 2016 [employer].     IF you received an x-ray today, you will receive an invoice from [facility]. Please contact [facility] at [phone] with questions or concerns regarding your invoice.   IF you received labwork today, you will receive an invoice from [employer]. Please  contact [facility] at [phone] with questions or concerns regarding your invoice.   Our billing staff will not be able to assist you with questions regarding bills from these companies.  You will be contacted with the lab results as soon as they are available. The fastest way to get your results is to activate your My Chart account. Instructions are located on the last page of this paperwork. If you have not  heard from us regarding the results in 2 weeks, please contact this office.

## 2016-04-12 NOTE — Progress Notes (Signed)
Marissa MussKristi J Copeland  MRN: [id number] DOB: 1981/04/26  Subjective:  Marissa Copeland is a 35 y.o. female seen in office today for a chief complaint of sore throat x 4 days. 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 Has associated muscle aches, chills, fatigue, full ears, and lightheadedness. Denies cough, congestion, and sneezing. Feels slightly better today but still very tired.  Of note, pt was around a neighbor five days ago who was being treated for strep. Also, her two children at home have had a cough for the past two weeks but no other symptoms.   Pt had mono test two years ago and was informed test results showed that she had mono at some point in her life but she does not recall ever having mono.   Review of Systems  HENT: Negative for trouble swallowing.   Respiratory: Negative for shortness of breath.   Cardiovascular: Negative for chest pain and palpitations.  Gastrointestinal: Negative for nausea, vomiting, abdominal pain and diarrhea.    Patient Active Problem List   Diagnosis Date Noted  . Chronic cholecystitis with calculus 05/17/2014  . Migraines 10/02/2012    Current Outpatient Prescriptions on File Prior to Visit  Medication Sig Dispense Refill  . Cyanocobalamin (B-12 PO) Take 1 tablet by mouth daily.    . Homeopathic Products (COLD RELIEF PO) Take 2 capsules by mouth 2 (two) times daily as needed (for cold).    Marland Kitchen. HYDROcodone-acetaminophen (NORCO/VICODIN) 5-325 MG per tablet Take 1-2 tablets by mouth every 6 (six) hours as needed for moderate pain or severe pain. 15 tablet 0  . levonorgestrel-ethinyl estradiol (ENPRESSE,TRIVORA) tablet Take 1 tablet by mouth daily. 1 Package 11  . loratadine-pseudoephedrine (CLARITIN-D 12-HOUR) 5-120 MG tablet Take 1 tablet by mouth once.     No current facility-administered medications on file prior to visit.    Allergies    Allergen Reactions  . Amoxicillin Rash    Has patient had a PCN reaction causing immediate rash, facial/tongue/throat swelling, SOB or lightheadedness with hypotension: Unknown Has patient had a PCN reaction causing severe rash involving mucus membranes or skin necrosis: Yes  Has patient had a PCN reaction that required hospitalization: No but was already in hospital when reaction occurred  Has patient had a PCN reaction occurring within the last 10 years: No  If all of the above answers are "NO", then may proceed with Cephalosporin use.     Objective:  BP 114/68 mmHg  Pulse 97  Temp(Src) 98.2 F (36.8 C) (Oral)  Resp 18  Ht 5\' 4"  (1.626 m)  Wt 129 lb (58.514 kg)  BMI 22.13 kg/m2  SpO2 99%  LMP 02/23/2016 (Approximate)  Physical Exam  Constitutional: She is oriented to person, place, and time.  Well developed, well nourished, appears fatigued.   HENT:  Head: Normocephalic and atraumatic.  Nose: Rhinorrhea present.  Mouth/Throat: Uvula is midline. Oropharyngeal exudate present. No posterior oropharyngeal erythema.  Eyes: Conjunctivae are normal.  Neck: Normal range of motion.  Cardiovascular: Normal rate, regular rhythm and normal heart sounds.   Pulmonary/Chest: Effort normal.  Lymphadenopathy:       Head (right side): No submental, no submandibular, no tonsillar, no preauricular, no posterior auricular and no occipital adenopathy present.       Head (left side): No submental, no submandibular, no tonsillar, no preauricular, no posterior auricular and no occipital adenopathy present.  She has cervical adenopathy.       Right cervical: Posterior cervical adenopathy present. No superficial cervical and no deep cervical adenopathy present.      Left cervical: Posterior cervical adenopathy present. No superficial cervical and no deep cervical adenopathy present.       Right: No supraclavicular adenopathy present.       Left: No supraclavicular adenopathy present.   Neurological: She is alert and oriented to person, place, and time. Gait normal.  Skin: Skin is warm and dry.   Results for orders placed or performed in visit on 04/12/16 (from the past 24 hour(s))  POCT rapid strep A     Status: None   Collection Time: 04/12/16 10:27 AM  Result Value Ref Range   Rapid Strep A Screen Negative Negative     Assessment and Plan :   1. Sore throat - POCT rapid strep A - Epstein-Barr virus VCA antibody panel -Salt water gargles and hot tea with honey -Tylenol/motrin prn pain  -Return to clinic if symptoms worsen, do not improve, or as needed  Benjiman Core PA-C  [facility] 04/12/2016 9:45 PM

## 2016-04-13 LAB — CULTURE, GROUP A STREP: Organism ID, Bacteria: NORMAL

## 2016-04-13 LAB — EPSTEIN-BARR VIRUS VCA ANTIBODY PANEL
EBV NA IgG: 76.5 U/mL — ABNORMAL HIGH
EBV VCA IgG: 158 U/mL — ABNORMAL HIGH
EBV VCA IgM: <36 U/mL

## 2016-04-16 ENCOUNTER — Encounter: Payer: Self-pay | Admitting: Physician Assistant

## 2016-04-19 ENCOUNTER — Telehealth: Payer: Self-pay

## 2016-04-19 NOTE — Telephone Encounter (Signed)
Please review

## 2016-04-19 NOTE — Telephone Encounter (Signed)
PATIENT STATES SHE SAW SARAH WEBER ON Thursday FOR POSS. STREP THROAT. SHE HAS CALLED AN LEFT A MESSAGE ON THE LAB'S VOICE MAIL ON Monday AND TUESDAY AND SHE HAS NOT RECEIVED A CALL BACK. SHE WOULD LIKE SOMEONE TO TELL HER WHAT HER LAB RESULTS ARE. BEST PHONE [phone]  PHARMACY CHOICE IS [facility] ON [address].  MBC

## 2016-04-20 NOTE — Telephone Encounter (Signed)
Called pt and advised message from provider on their voicemail.  

## 2016-04-20 NOTE — Telephone Encounter (Signed)
Please call patient and let her know that her strep culture was negative and her mono test is showing that she does not have an active infection but did indeed have a past infection at some time in her life. Continue with supportive care treatment plan. Thank you!

## 2016-05-26 IMAGING — CR DG ABDOMEN ACUTE W/ 1V CHEST
3 series · 3 of 3 positions shown · non-contrast
Comparison: None.

CLINICAL DATA: Left upper quadrant abdominal pain

EXAM:
ACUTE ABDOMEN SERIES (ABDOMEN 2 VIEW & CHEST 1 VIEW)

[PA]
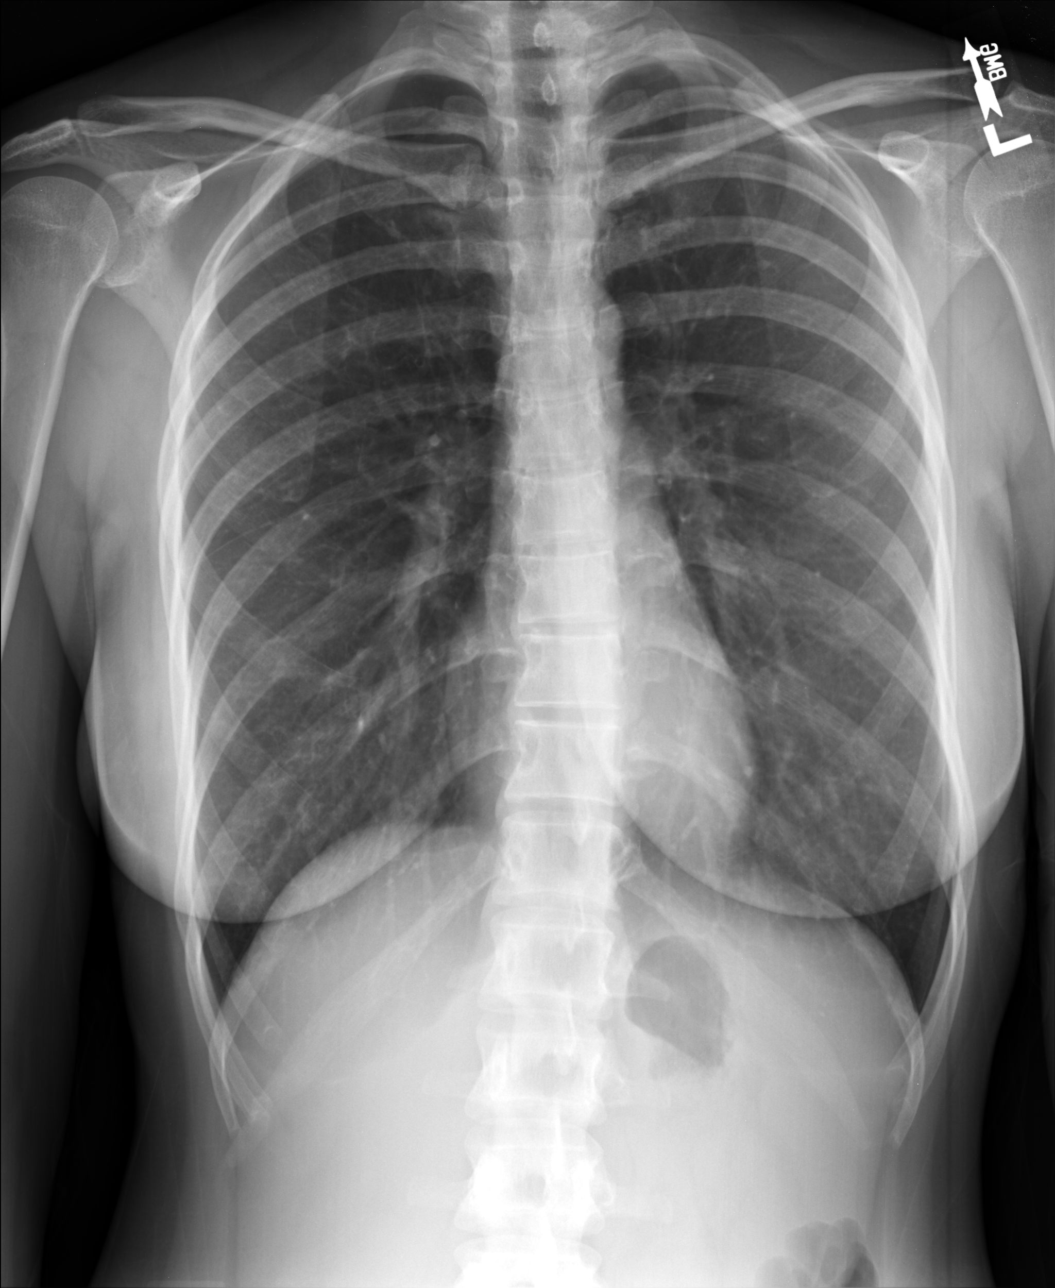

[AP (1 of 2)]
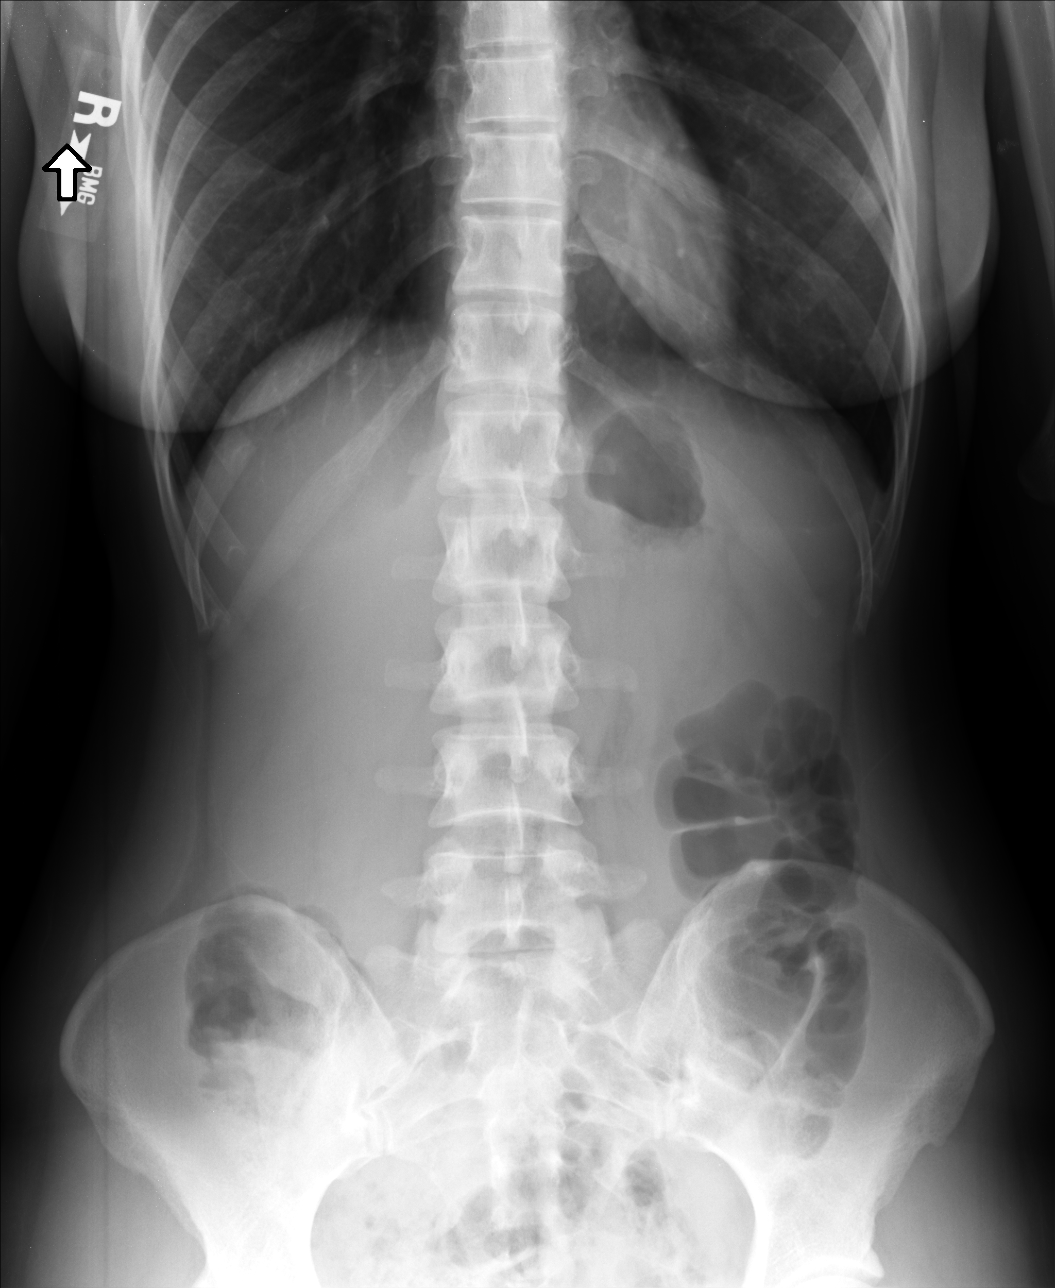

[AP (2 of 2)]
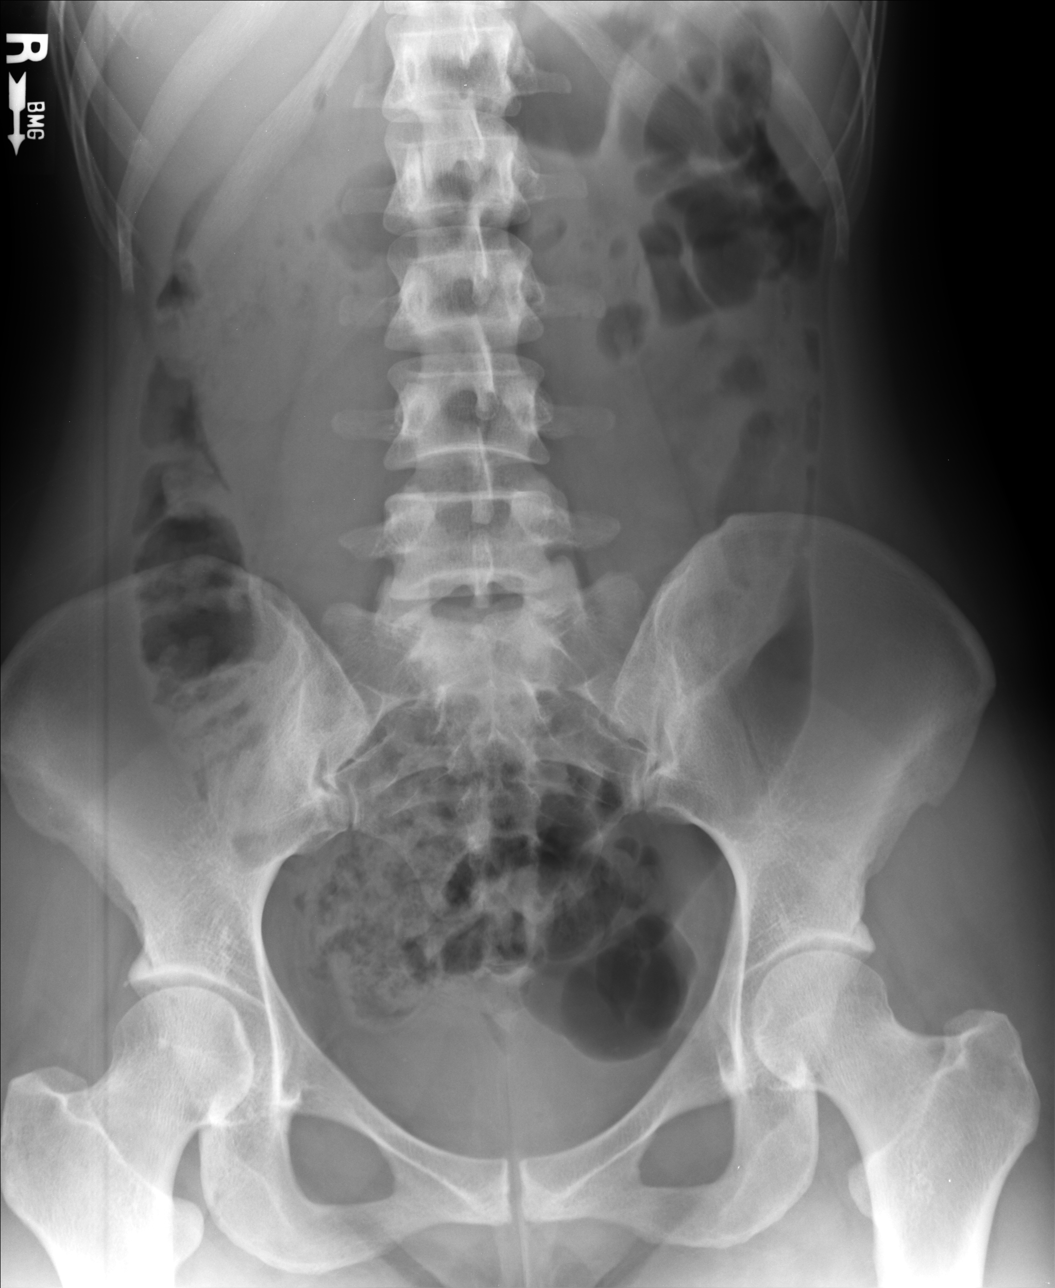

[3 of 3 positions shown; findings below may reference images not displayed]

FINDINGS: There is no evidence of dilated bowel loops or free intraperitoneal
air. No radiopaque calculi or other significant radiographic
abnormality is seen. Heart size and mediastinal contours are within
normal limits. Both lungs are clear. Mild leftward curvature of the
thoracic spine centered at T8 is noted which may be positional.
IMPRESSION: Negative abdominal radiographs.  No acute cardiopulmonary disease.

## 2018-05-22 ENCOUNTER — Inpatient Hospital Stay (HOSPITAL_COMMUNITY)
Admission: AD | Admit: 2018-05-22 | Discharge: 2018-05-22 | Discharge status: Absent without leave | Payer: BLUE CROSS/BLUE SHIELD | Attending: Family Medicine | Admitting: Family Medicine

## 2021-09-03 ENCOUNTER — Other Ambulatory Visit: Payer: Self-pay

## 2021-09-03 ENCOUNTER — Emergency Department (HOSPITAL_COMMUNITY): Payer: Commercial Managed Care - PPO

## 2021-09-03 ENCOUNTER — Emergency Department (HOSPITAL_COMMUNITY)
Admission: EM | Admit: 2021-09-03 | Discharge: 2021-09-03 | Disposition: A | Discharge status: Home / Self Care | Payer: Commercial Managed Care - PPO | Attending: Emergency Medicine | Admitting: Emergency Medicine

## 2021-09-03 DIAGNOSIS — R55 Syncope and collapse: Secondary | ICD-10-CM

## 2021-09-03 DIAGNOSIS — N9489 Other specified conditions associated with female genital organs and menstrual cycle: Secondary | ICD-10-CM | POA: Diagnosis not present

## 2021-09-03 DIAGNOSIS — R531 Weakness: Secondary | ICD-10-CM | POA: Insufficient documentation

## 2021-09-03 LAB — CBC WITH DIFFERENTIAL/PLATELET
Abs Immature Granulocytes: 0.02 10*3/uL (ref 0.00–0.07)
Basophils Absolute: 0 10*3/uL (ref 0.0–0.1)
Basophils Relative: 0 %
Eosinophils Absolute: 0.1 10*3/uL (ref 0.0–0.5)
Eosinophils Relative: 1 %
HCT: 39.4 % (ref 36.0–46.0)
Hemoglobin: 13 g/dL (ref 12.0–15.0)
Immature Granulocytes: 0 %
Lymphocytes Relative: 25 %
Lymphs Abs: 1.7 10*3/uL (ref 0.7–4.0)
MCH: 30.5 pg (ref 26.0–34.0)
MCHC: 33 g/dL (ref 30.0–36.0)
MCV: 92.5 fL (ref 80.0–100.0)
Monocytes Absolute: 0.5 10*3/uL (ref 0.1–1.0)
Monocytes Relative: 7 %
Neutro Abs: 4.5 10*3/uL (ref 1.7–7.7)
Neutrophils Relative %: 67 %
Platelets: 271 10*3/uL (ref 150–400)
RBC: 4.26 MIL/uL (ref 3.87–5.11)
RDW: 12.7 % (ref 11.5–15.5)
WBC: 6.9 10*3/uL (ref 4.0–10.5)
nRBC: 0 % (ref 0.0–0.2)

## 2021-09-03 LAB — COMPREHENSIVE METABOLIC PANEL
ALT: 20 U/L (ref 0–44)
AST: 20 U/L (ref 15–41)
Albumin: 4.2 g/dL (ref 3.5–5.0)
Alkaline Phosphatase: 54 U/L (ref 38–126)
Anion gap: 7 (ref 5–15)
BUN: 16 mg/dL (ref 6–20)
CO2: 23 mmol/L (ref 22–32)
Calcium: 9.1 mg/dL (ref 8.9–10.3)
Chloride: 110 mmol/L (ref 98–111)
Creatinine, Ser: 0.84 mg/dL (ref 0.44–1.00)
GFR, Estimated: >60 mL/min (ref >60)
Glucose, Bld: 94 mg/dL (ref 70–99)
Potassium: 3.3 mmol/L — ABNORMAL LOW (ref 3.5–5.1)
Sodium: 140 mmol/L (ref 135–145)
Total Bilirubin: 0.2 mg/dL — ABNORMAL LOW (ref 0.3–1.2)
Total Protein: 7.2 g/dL (ref 6.5–8.1)

## 2021-09-03 LAB — CBG MONITORING, ED: Glucose-Capillary: 104 mg/dL — ABNORMAL HIGH (ref 70–99)

## 2021-09-03 LAB — HCG, QUANTITATIVE, PREGNANCY: hCG, Beta Chain, Quant, S: <1 m[IU]/mL (ref <5)

## 2021-09-03 NOTE — ED Triage Notes (Signed)
Several near syncopal events, witnessed by husband today. Patient had bupropion dose increased Tuesday from 150-300mg , and had increasing fogginess and confusion.

## 2021-09-03 NOTE — Discharge Instructions (Signed)
Please follow-up with your regular doctor.  Call their office tomorrow to discuss your episode.  For now I would recommend going back to prior dosing (150 mg daily).  If you have any episodes of passing out, weakness, chest pain, difficulty breathing or other new concerning symptom, come back to ER for reassessment.

## 2021-09-03 NOTE — ED Notes (Signed)
Pt was able to walk to the bathroom but on the way back to her room her legs gave out and she was assisted down to her knees.  She was able to regain her feet with assistance and we had the bed brought out into the hallway to get pt off their feet.

## 2021-09-03 NOTE — ED Provider Notes (Signed)
[facility]ENC[facility] Provider Note   CSN: [id number] Arrival date & time: 09/03/21  1507     History Chief Complaint  Patient presents with   Near Syncope    Marissa Copeland is a 40 y.o. female.  Presented to the emergency room with concern for near syncope.  Patient reports that while she was in the kitchen this afternoon she was sitting down when she started to feel lightheaded, states that she has had this sensation before and felt she was going to pass out so she laid on the ground.  Did not completely pass out but felt like she was going to pass out.  Husband came to evaluate her and said that she was very weak and had a hard time getting up initially.  After few minutes however patient was able to get up.  Patient states that she felt generally weak and had a hard time walking.  States that since being in the ER she has been able to walk.  Has tingling sensation in both of her arms.  Denies numbness.  No speech or vision change.  No bladder or bowel incontinence, no seizure activity.  Endorses medication change earlier this week, Wellbutrin increased 150 to 300.  Endorses history of cyclothymia, takes lamotrigine and Wellbutrin.  HPI     Past Medical History:  Diagnosis Date   Allergy     Patient Active Problem List   Diagnosis Date Noted   Chronic cholecystitis with calculus 05/17/2014   Migraines 10/02/2012    No past surgical history on file.   OB History     Gravida  2   Para  2   Term  2   Preterm      AB      Living  2      SAB      IAB      Ectopic      Multiple      Live Births  2           Family History  Problem Relation Age of Onset   Aneurysm Father    Diabetes Maternal Aunt    Diabetes Maternal Grandfather    Fibromyalgia Paternal Grandmother    Heart disease Paternal Grandfather     Social History   Tobacco Use   Smoking status: Never   Smokeless tobacco: Never  Substance Use Topics    Alcohol use: No   Drug use: No    Home Medications Prior to Admission medications   Medication Sig Start Date End Date Taking? Authorizing Provider  Cyanocobalamin (B-12 PO) Take 1 tablet by mouth daily.    [provider]  Homeopathic Products (COLD RELIEF PO) Take 2 capsules by mouth 2 (two) times daily as needed (for cold).    [provider]  HYDROcodone-acetaminophen (NORCO/VICODIN) 5-325 MG per tablet Take 1-2 tablets by mouth every 6 (six) hours as needed for moderate pain or severe pain. 04/30/14   Roxy Horseman, PA-C  levonorgestrel-ethinyl estradiol (ENPRESSE,TRIVORA) tablet Take 1 tablet by mouth daily. 10/02/12   Nigel Bridgeman, CNM  loratadine-pseudoephedrine (CLARITIN-D 12-HOUR) 5-120 MG tablet Take 1 tablet by mouth once.    [provider]    Allergies    Amoxicillin  Review of Systems   Review of Systems  Constitutional:  Negative for chills and fever.  HENT:  Negative for ear pain and sore throat.   Eyes:  Negative for pain and visual disturbance.  Respiratory:  Negative for  cough and shortness of breath.   Cardiovascular:  Negative for chest pain and palpitations.  Gastrointestinal:  Negative for abdominal pain and vomiting.  Genitourinary:  Negative for dysuria and hematuria.  Musculoskeletal:  Negative for arthralgias and back pain.  Skin:  Negative for color change and rash.  Neurological:  Positive for syncope and weakness. Negative for seizures.  All other systems reviewed and are negative.  Physical Exam Updated Vital Signs BP (!) 150/100 (BP Location: Right Arm)   Pulse (!) 105   Resp (!) 25   SpO2 100%   Physical Exam Vitals and nursing note reviewed.  Constitutional:      General: She is not in acute distress.    Appearance: She is well-developed.  HENT:     Head: Normocephalic and atraumatic.  Eyes:     Conjunctiva/sclera: Conjunctivae normal.  Cardiovascular:     Rate and Rhythm: Normal rate and regular rhythm.      Heart sounds: No murmur heard. Pulmonary:     Effort: Pulmonary effort is normal. No respiratory distress.     Breath sounds: Normal breath sounds.  Abdominal:     Palpations: Abdomen is soft.     Tenderness: There is no abdominal tenderness.  Musculoskeletal:        General: No swelling.     Cervical back: Neck supple.  Skin:    General: Skin is warm and dry.     Capillary Refill: Capillary refill takes less than 2 seconds.  Neurological:     Mental Status: She is alert.     Comments: AAOx3 CN 2-12 intact, speech clear visual fields intact 5/5 strength in b/l UE and LE Sensation to light touch intact in b/l UE and LE Normal FNF Normal LE reflexes  Psychiatric:        Mood and Affect: Mood normal.    ED Results / Procedures / Treatments   Labs (all labs ordered are listed, but only abnormal results are displayed) Labs Reviewed  CBG MONITORING, ED - Abnormal; Notable for the following components:      Result Value   Glucose-Capillary 104 (*)    All other components within normal limits  CBC WITH DIFFERENTIAL/PLATELET  COMPREHENSIVE METABOLIC PANEL  HCG, QUANTITATIVE, PREGNANCY    EKG None  Radiology No results found.  Procedures Procedures   Medications Ordered in ED Medications - No data to display  ED Course  I have reviewed the triage vital signs and the nursing notes.  Pertinent labs & imaging results that were available during my care of the patient were reviewed by me and considered in my medical decision making (see chart for details).    MDM Rules/Calculators/A&P                           40 year old presents to ER with concern for near syncope, episode of generalized weakness.  When I evaluated patient, weakness had resolved.  She has a normal neurologic exam.  No focal weakness or numbness identified.  Basic labs are stable.  EKG unremarkable, no events on telemetry monitoring.  On reassessment, patient remains well-appearing, no ongoing symptoms.   Personally performed ambulation trial in room on reassessment, she was able to ambulate in room without assistance, no weakness, no lightheadedness.  Will discharge home with husband.  Out of an abundance of precaution, recommended reverting to her prior dose of Wellbutrin (150 mg daily).  Recommend follow-up with PCP.  Reviewed return precautions and  discharged.    After the discussed management above, the patient was determined to be safe for discharge.  The patient was in agreement with this plan and all questions regarding their care were answered.  ED return precautions were discussed and the patient will return to the ED with any significant worsening of condition.   Final Clinical Impression(s) / ED Diagnoses Final diagnoses:  None    Rx / DC Orders ED Discharge Orders     None        Lucrezia Starch, MD 09/03/21 1722
